# Patient Record
Sex: Male | Born: 1970 | Race: White | Hispanic: No | Marital: Married | Smoking: Current every day smoker
Health system: Southern US, Community
[De-identification: ages and names within clinical notes are randomized; demographics above are authoritative.]

## PROBLEM LIST (undated history)

## (undated) DIAGNOSIS — G629 Polyneuropathy, unspecified: Secondary | ICD-10-CM

## (undated) DIAGNOSIS — A048 Other specified bacterial intestinal infections: Secondary | ICD-10-CM

## (undated) DIAGNOSIS — M4300 Spondylolysis, site unspecified: Secondary | ICD-10-CM

## (undated) HISTORY — PX: UPPER GI ENDOSCOPY: SHX6162

## (undated) HISTORY — PX: HERNIA REPAIR: SHX51

## (undated) HISTORY — PX: OTHER SURGICAL HISTORY: SHX169

## (undated) HISTORY — PX: CHOLECYSTECTOMY: SHX55

---

## 2014-09-19 ENCOUNTER — Emergency Department (HOSPITAL_COMMUNITY)
Admission: EM | Admit: 2014-09-19 | Discharge: 2014-09-20 | Disposition: A | Discharge status: Home / Self Care | Payer: Medicare PPO | Attending: Emergency Medicine | Admitting: Emergency Medicine

## 2014-09-19 ENCOUNTER — Encounter (HOSPITAL_COMMUNITY): Payer: Self-pay | Admitting: Emergency Medicine

## 2014-09-19 DIAGNOSIS — Z8669 Personal history of other diseases of the nervous system and sense organs: Secondary | ICD-10-CM | POA: Diagnosis not present

## 2014-09-19 DIAGNOSIS — Z8739 Personal history of other diseases of the musculoskeletal system and connective tissue: Secondary | ICD-10-CM | POA: Diagnosis not present

## 2014-09-19 DIAGNOSIS — Z9889 Other specified postprocedural states: Secondary | ICD-10-CM | POA: Diagnosis not present

## 2014-09-19 DIAGNOSIS — R1013 Epigastric pain: Secondary | ICD-10-CM | POA: Insufficient documentation

## 2014-09-19 DIAGNOSIS — Z720 Tobacco use: Secondary | ICD-10-CM | POA: Insufficient documentation

## 2014-09-19 DIAGNOSIS — Z8619 Personal history of other infectious and parasitic diseases: Secondary | ICD-10-CM | POA: Diagnosis not present

## 2014-09-19 DIAGNOSIS — Z79899 Other long term (current) drug therapy: Secondary | ICD-10-CM | POA: Diagnosis not present

## 2014-09-19 DIAGNOSIS — Z9089 Acquired absence of other organs: Secondary | ICD-10-CM | POA: Diagnosis not present

## 2014-09-19 HISTORY — DX: Other specified bacterial intestinal infections: A04.8

## 2014-09-19 HISTORY — DX: Polyneuropathy, unspecified: G62.9

## 2014-09-19 HISTORY — DX: Spondylolysis, site unspecified: M43.00

## 2014-09-19 MED ORDER — ONDANSETRON 8 MG PO TBDP
8.0000 mg | ORAL_TABLET | Freq: Once | ORAL | Status: AC
  Administered 2014-09-19: 8 mg via ORAL
  Filled 2014-09-19: qty 1

## 2014-09-19 NOTE — ED Notes (Signed)
Patient c/o burning in his stomach, worsens when he eats. States @ 1 week ago he was having watery yellow diarrhea, last episode this am. On average is having 2-3 episodes of diarrhea per day, states he is also having increased urination, c/o bladder pressure as well. Patient has not been evaluated for this episode of illness, states he started taking his ulcer medication @ 1 week ago (Carafate). Hx of H Pylori, was positive a few years ago and again a few months ago. Patient states this feels similar to his H pylori in the past, except previously he also had bloody stools which he denies at this time. Patient had colonoscopy and endoscopy a few months ago which showed polyps and inflamed stomach lining.

## 2014-09-20 LAB — CBC WITH DIFFERENTIAL/PLATELET
BASOS ABS: 0 10*3/uL (ref 0.0–0.1)
Basophils Relative: 0 % (ref 0–1)
Eosinophils Absolute: 0.3 10*3/uL (ref 0.0–0.7)
Eosinophils Relative: 2 % (ref 0–5)
HCT: 45.9 % (ref 39.0–52.0)
Hemoglobin: 16.2 g/dL (ref 13.0–17.0)
LYMPHS ABS: 3.2 10*3/uL (ref 0.7–4.0)
Lymphocytes Relative: 23 % (ref 12–46)
MCH: 30.2 pg (ref 26.0–34.0)
MCHC: 35.3 g/dL (ref 30.0–36.0)
MCV: 85.5 fL (ref 78.0–100.0)
MONO ABS: 0.9 10*3/uL (ref 0.1–1.0)
Monocytes Relative: 6 % (ref 3–12)
NEUTROS ABS: 9.7 10*3/uL — AB (ref 1.7–7.7)
Neutrophils Relative %: 69 % (ref 43–77)
Platelets: 260 10*3/uL (ref 150–400)
RBC: 5.37 MIL/uL (ref 4.22–5.81)
RDW: 13.2 % (ref 11.5–15.5)
WBC: 14.1 10*3/uL — ABNORMAL HIGH (ref 4.0–10.5)

## 2014-09-20 LAB — URINALYSIS, ROUTINE W REFLEX MICROSCOPIC
Bilirubin Urine: NEGATIVE
Glucose, UA: NEGATIVE mg/dL
HGB URINE DIPSTICK: NEGATIVE
Ketones, ur: NEGATIVE mg/dL
LEUKOCYTES UA: NEGATIVE
Nitrite: NEGATIVE
PH: 6.5 (ref 5.0–8.0)
Protein, ur: NEGATIVE mg/dL
Specific Gravity, Urine: 1.011 (ref 1.005–1.030)
Urobilinogen, UA: 0.2 mg/dL (ref 0.0–1.0)

## 2014-09-20 LAB — COMPREHENSIVE METABOLIC PANEL
ALBUMIN: 4.5 g/dL (ref 3.5–5.2)
ALT: 13 U/L (ref 0–53)
AST: 14 U/L (ref 0–37)
Alkaline Phosphatase: 77 U/L (ref 39–117)
Anion gap: 10 (ref 5–15)
BILIRUBIN TOTAL: 0.6 mg/dL (ref 0.3–1.2)
BUN: 7 mg/dL (ref 6–23)
CALCIUM: 9.8 mg/dL (ref 8.4–10.5)
CO2: 26 mmol/L (ref 19–32)
Chloride: 102 mmol/L (ref 96–112)
Creatinine, Ser: 0.93 mg/dL (ref 0.50–1.35)
GFR calc Af Amer: >90 mL/min (ref >90)
GLUCOSE: 101 mg/dL — AB (ref 70–99)
Potassium: 3.5 mmol/L (ref 3.5–5.1)
Sodium: 138 mmol/L (ref 135–145)
Total Protein: 7.7 g/dL (ref 6.0–8.3)

## 2014-09-20 LAB — I-STAT TROPONIN, ED: TROPONIN I, POC: 0 ng/mL (ref 0.00–0.08)

## 2014-09-20 LAB — LIPASE, BLOOD: LIPASE: 43 U/L (ref 11–59)

## 2014-09-20 MED ORDER — GI COCKTAIL ~~LOC~~
30.0000 mL | Freq: Once | ORAL | Status: AC
  Administered 2014-09-20: 30 mL via ORAL
  Filled 2014-09-20: qty 30

## 2014-09-20 MED ORDER — AMOXICILLIN 500 MG PO CAPS: 500.0000 mg | ORAL_CAPSULE | Freq: Two times a day (BID) | ORAL | Status: DC

## 2014-09-20 MED ORDER — ESOMEPRAZOLE MAGNESIUM 40 MG PO CPDR: DELAYED_RELEASE_CAPSULE | ORAL | Status: DC

## 2014-09-20 MED ORDER — SUCRALFATE 1 GM/10ML PO SUSP: 1.0000 g | Freq: Three times a day (TID) | ORAL | Status: DC

## 2014-09-20 MED ORDER — CLARITHROMYCIN 500 MG PO TABS: 500.0000 mg | ORAL_TABLET | Freq: Two times a day (BID) | ORAL | Status: DC

## 2014-09-20 NOTE — ED Provider Notes (Signed)
CSN: [id number]     Arrival date & time 09/19/14  2253 History   First MD Initiated Contact with Patient 09/20/14 [phone]     Chief Complaint  Patient presents with  . Abdominal Pain     (Consider location/radiation/quality/duration/timing/severity/associated sxs/prior Treatment) HPI  This is a 44 year old male with a history of chronic back pain as well as Helicobacter pylori infection. He has been having stomach trouble since October of last year. This consist of a constant burning pain in his epigastrium made worse with eating or drinking. He has been placed on 2 courses of triple therapy for H. pylori in the past. He has been treated with Nexium and Carafate.  He had been on chronic narcotic therapy for his back pain which he discontinued last year. He thinks this may precipitated his stomach problems. Several months ago he was placed back on oxycodone for his back pain. He states this To his GI symptoms in check. He decided that he does not wish to be on narcotics any longer and discontinued his oxycodone about a week ago. Within one day he had a return of his severe, burning, epigastric pain which he states is worse than the pain he had in his back. He has had occasional nausea but no vomiting. He has had diarrhea which she describes as yellow but not bloody or black. He is also having some discomfort in his bladder which is constant but somewhat worse when urinating. He denies burning with urination or burning in his penis. He has not had a fever. He does have perianal burning which he attributes to stomach acid.  He started taking his Carafate again without relief. He is on Nexium as well.  He states his previous gastroenterologist "didn't do anything for me although he did fine irritation of my stomach when he did an endoscopy". He states he has had CT scans in the past which have "always been normal". He is status post cholecystectomy. The patient is noted the area does not have a local  physician.  Past Medical History  Diagnosis Date  . H. pylori infection   . Neuropathy   . Spondylolysis    Past Surgical History  Procedure Laterality Date  . Hernia repair    . Colonscopy    . Cholecystectomy    . Upper gi endoscopy     No family history on file. History  Substance Use Topics  . Smoking status: Current Every Day Smoker -- 0.20 packs/day    Types: Cigarettes  . Smokeless tobacco: Not on file  . Alcohol Use: No    Review of Systems  All other systems reviewed and are negative.   Allergies  Clindamycin/lincomycin and Lomotil  Home Medications   Prior to Admission medications   Medication Sig Start Date End Date Taking? Authorizing Provider  acidophilus (RISAQUAD) CAPS capsule Take 1 capsule by mouth daily.   Yes Historical Provider, MD  calcium carbonate (TUMS - DOSED IN MG ELEMENTAL CALCIUM) 500 MG chewable tablet Chew 1 tablet by mouth 2 (two) times daily with a meal.   Yes Historical Provider, MD  Calcium Carbonate-Simethicone (ROLAIDS PLUS GAS RELIEF EX ST PO) Take 1 tablet by mouth 2 (two) times daily as needed.   Yes Historical Provider, MD  clonazePAM (KLONOPIN) 1 MG tablet Take 1 mg by mouth 2 (two) times daily.   Yes Historical Provider, MD  esomeprazole (NEXIUM) 20 MG capsule Take 40 mg by mouth daily at 12 noon.   Yes Historical Provider,  MD  oxyCODONE-acetaminophen (PERCOCET) 10-325 MG per tablet Take 1 tablet by mouth every 4 (four) hours as needed for pain.   Yes Historical Provider, MD  sucralfate (CARAFATE) 1 G tablet Take 1 g by mouth 4 (four) times daily -  with meals and at bedtime.   Yes Historical Provider, MD   BP 137/80 mmHg  Pulse 92  Temp(Src) 97.8 F (36.6 C) (Oral)  Resp 20  SpO2 94%   Physical Exam  General: Well-developed, well-nourished male in no acute distress; appearance consistent with age of record HENT: normocephalic; atraumatic Eyes: pupils equal, round and reactive to light; extraocular muscles intact Neck:  supple Heart: regular rate and rhythm Lungs: clear to auscultation bilaterally Abdomen: soft; nondistended; mild periumbilical tenderness; no masses or hepatosplenomegaly; bowel sounds present Extremities: No deformity; full range of motion; pulses normal Neurologic: Awake, alert and oriented; motor function intact in all extremities and symmetric; no facial droop Skin: Warm and dry Psychiatric: Circumstantial speech    ED Course  Procedures (including critical care time)   MDM   Nursing notes and vitals signs, including pulse oximetry, reviewed.  Summary of this visit's results, reviewed by myself:  Labs:  Results for orders placed or performed during the hospital encounter of 09/19/14 (from the past 24 hour(s))  CBC with Differential     Status: Abnormal   Collection Time: 09/20/14 12:43 AM  Result Value Ref Range   WBC 14.1 (H) 4.0 - 10.5 K/uL   RBC 5.37 4.22 - 5.81 MIL/uL   Hemoglobin 16.2 13.0 - 17.0 g/dL   HCT 84.1 32.4 - 40.1 %   MCV 85.5 78.0 - 100.0 fL   MCH 30.2 26.0 - 34.0 pg   MCHC 35.3 30.0 - 36.0 g/dL   RDW 02.7 25.3 - 66.4 %   Platelets 260 150 - 400 K/uL   Neutrophils Relative % 69 43 - 77 %   Neutro Abs 9.7 (H) 1.7 - 7.7 K/uL   Lymphocytes Relative 23 12 - 46 %   Lymphs Abs 3.2 0.7 - 4.0 K/uL   Monocytes Relative 6 3 - 12 %   Monocytes Absolute 0.9 0.1 - 1.0 K/uL   Eosinophils Relative 2 0 - 5 %   Eosinophils Absolute 0.3 0.0 - 0.7 K/uL   Basophils Relative 0 0 - 1 %   Basophils Absolute 0.0 0.0 - 0.1 K/uL  Comprehensive metabolic panel     Status: Abnormal   Collection Time: 09/20/14 12:43 AM  Result Value Ref Range   Sodium 138 135 - 145 mmol/L   Potassium 3.5 3.5 - 5.1 mmol/L   Chloride 102 96 - 112 mmol/L   CO2 26 19 - 32 mmol/L   Glucose, Bld 101 (H) 70 - 99 mg/dL   BUN 7 6 - 23 mg/dL   Creatinine, Ser 4.03 0.50 - 1.35 mg/dL   Calcium 9.8 8.4 - 47.4 mg/dL   Total Protein 7.7 6.0 - 8.3 g/dL   Albumin 4.5 3.5 - 5.2 g/dL   AST 14 0 - 37 U/L    ALT 13 0 - 53 U/L   Alkaline Phosphatase 77 39 - 117 U/L   Total Bilirubin 0.6 0.3 - 1.2 mg/dL   GFR calc non Af Amer >90 >90 mL/min   GFR calc Af Amer >90 >90 mL/min   Anion gap 10 5 - 15  Lipase, blood     Status: None   Collection Time: 09/20/14 12:43 AM  Result Value Ref Range   Lipase  43 11 - 59 U/L  I-stat troponin, ED (only if pt is 45 y.o. or older & pain is above umbilicus) - [facility]     Status: None   Collection Time: 09/20/14 12:46 AM  Result Value Ref Range   Troponin i, poc 0.00 0.00 - 0.08 ng/mL   Comment 3          Urinalysis, Routine w reflex microscopic     Status: None   Collection Time: 09/20/14  3:23 AM  Result Value Ref Range   Color, Urine YELLOW YELLOW   APPearance CLEAR CLEAR   Specific Gravity, Urine 1.011 1.005 - 1.030   pH 6.5 5.0 - 8.0   Glucose, UA NEGATIVE NEGATIVE mg/dL   Hgb urine dipstick NEGATIVE NEGATIVE   Bilirubin Urine NEGATIVE NEGATIVE   Ketones, ur NEGATIVE NEGATIVE mg/dL   Protein, ur NEGATIVE NEGATIVE mg/dL   Urobilinogen, UA 0.2 0.0 - 1.0 mg/dL   Nitrite NEGATIVE NEGATIVE   Leukocytes, UA NEGATIVE NEGATIVE   4:32 AM The patient did not get any relief of his epigastric discomfort with a GI cocktail. He suspects, and I agree, that his diarrhea is likely due to rebound after discontinuing his oxycodone. His epigastric pain is likely gastritis or peptic ulcer disease with pain of which may also have been masked by his oxycodone. He is requesting liquid Carafate as that seems to work better than Carafate tablets for him. He would also like to be treated again for Helicobacter pylori. Although retreatment guidelines recommend Flagyl and tetracycline, because of tetracyclines tendency to be upsetting to the stomach I think this would not be a regimen he would tolerate. We will try Biaxin and amoxicillin and continue his Nexium and switch to liquid Carafate. Will refer to gastroenterology locally.       Paula LibraJohn Quinteria Chisum, MD 09/20/14  [phone]

## 2014-09-20 NOTE — ED Notes (Signed)
MD at bedside. 

## 2015-03-09 DIAGNOSIS — M545 Low back pain, unspecified: Secondary | ICD-10-CM | POA: Insufficient documentation

## 2015-04-11 ENCOUNTER — Other Ambulatory Visit: Payer: Self-pay | Admitting: Orthopedic Surgery

## 2015-04-11 DIAGNOSIS — M5432 Sciatica, left side: Principal | ICD-10-CM

## 2015-04-11 DIAGNOSIS — M5431 Sciatica, right side: Secondary | ICD-10-CM

## 2015-04-27 ENCOUNTER — Ambulatory Visit
Admission: RE | Admit: 2015-04-27 | Discharge: 2015-04-27 | Disposition: A | Discharge status: Home / Self Care | Payer: Medicare PPO | Source: Ambulatory Visit | Attending: Orthopedic Surgery | Admitting: Orthopedic Surgery

## 2015-04-27 DIAGNOSIS — M5432 Sciatica, left side: Principal | ICD-10-CM

## 2015-04-27 DIAGNOSIS — M5431 Sciatica, right side: Secondary | ICD-10-CM

## 2015-07-21 DIAGNOSIS — M5481 Occipital neuralgia: Secondary | ICD-10-CM | POA: Insufficient documentation

## 2015-08-24 ENCOUNTER — Other Ambulatory Visit (HOSPITAL_COMMUNITY): Payer: Self-pay | Admitting: Psychiatry

## 2015-09-11 ENCOUNTER — Other Ambulatory Visit: Payer: Self-pay | Admitting: Internal Medicine

## 2015-09-11 DIAGNOSIS — G4486 Cervicogenic headache: Secondary | ICD-10-CM | POA: Insufficient documentation

## 2015-09-11 DIAGNOSIS — M542 Cervicalgia: Secondary | ICD-10-CM

## 2015-09-19 ENCOUNTER — Ambulatory Visit
Admission: RE | Admit: 2015-09-19 | Discharge: 2015-09-19 | Disposition: A | Discharge status: Home / Self Care | Payer: Medicare PPO | Source: Ambulatory Visit | Attending: Internal Medicine | Admitting: Internal Medicine

## 2015-09-19 DIAGNOSIS — M542 Cervicalgia: Secondary | ICD-10-CM

## 2015-11-07 DIAGNOSIS — I10 Essential (primary) hypertension: Secondary | ICD-10-CM | POA: Insufficient documentation

## 2015-11-08 DIAGNOSIS — F32A Depression, unspecified: Secondary | ICD-10-CM | POA: Insufficient documentation

## 2015-11-08 DIAGNOSIS — F419 Anxiety disorder, unspecified: Secondary | ICD-10-CM | POA: Insufficient documentation

## 2015-11-08 DIAGNOSIS — G4700 Insomnia, unspecified: Secondary | ICD-10-CM | POA: Insufficient documentation

## 2015-11-08 DIAGNOSIS — F329 Major depressive disorder, single episode, unspecified: Secondary | ICD-10-CM | POA: Insufficient documentation

## 2015-11-14 DIAGNOSIS — M5136 Other intervertebral disc degeneration, lumbar region: Secondary | ICD-10-CM | POA: Insufficient documentation

## 2016-11-25 IMAGING — MR MR CERVICAL SPINE W/O CM
4 of 5 series · 28 of 48 positions shown · non-contrast
Comparison: None.

CLINICAL DATA: Headache neck pain with left arm weakness for 6
months

EXAM:
MRI CERVICAL SPINE WITHOUT CONTRAST
TECHNIQUE: Multiplanar, multisequence MR imaging of the cervical spine was
performed. No intravenous contrast was administered.

[Series 3: T2 · sagittal · 3.0mm · 0.66mm/px · 6 of 13 slices shown (1 of 2)]
[im 1/13]
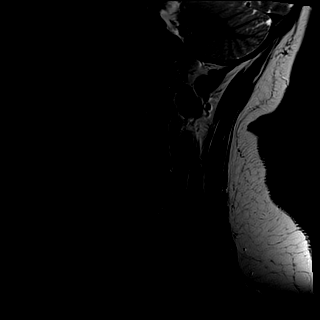
[im 3/13]
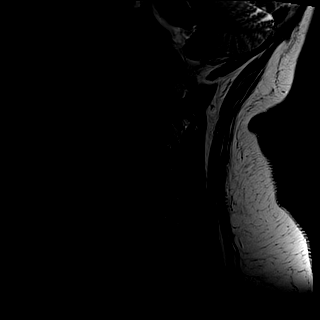
[im 5/13]
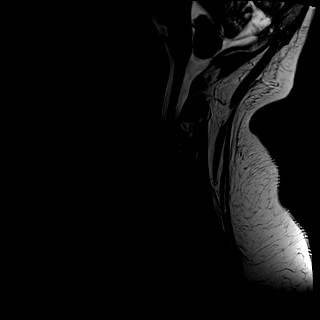
[im 8/13]
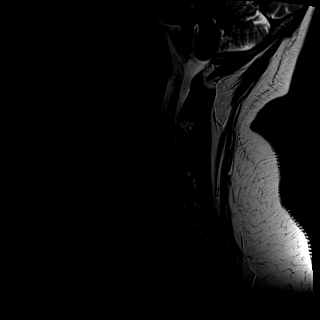
[im 10/13]
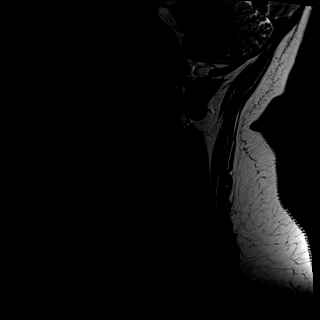
[im 13/13]
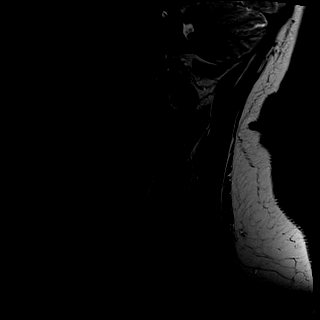

[Series 4: T1 · sagittal · 3.0mm · 0.41mm/px · 7 of 13 slices shown]
[im 1/13]
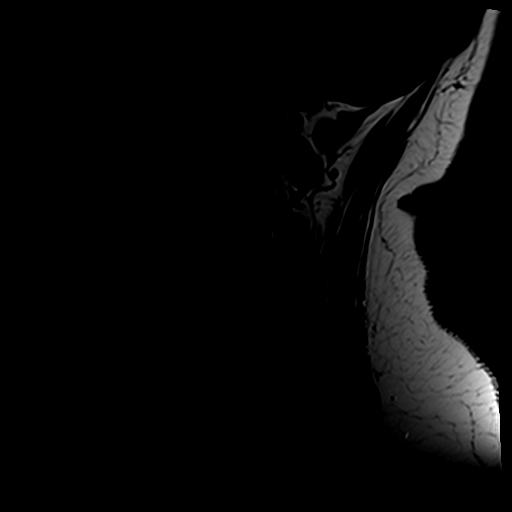
[im 3/13]
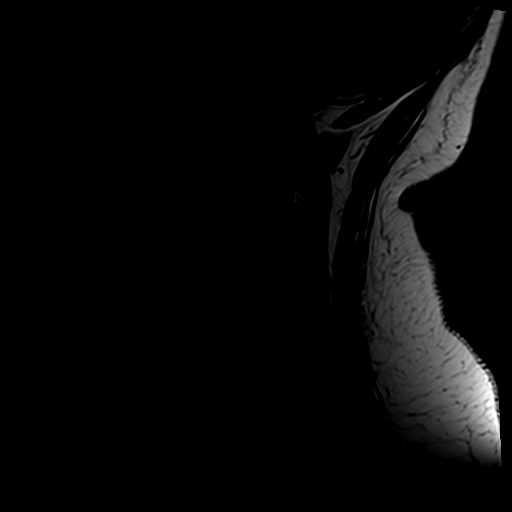
[im 5/13]
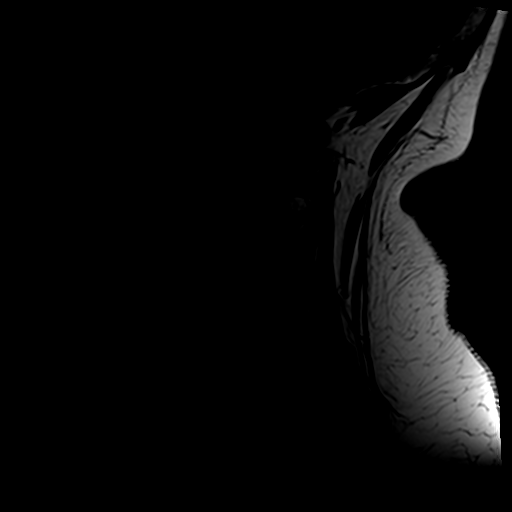
[im 7/13]
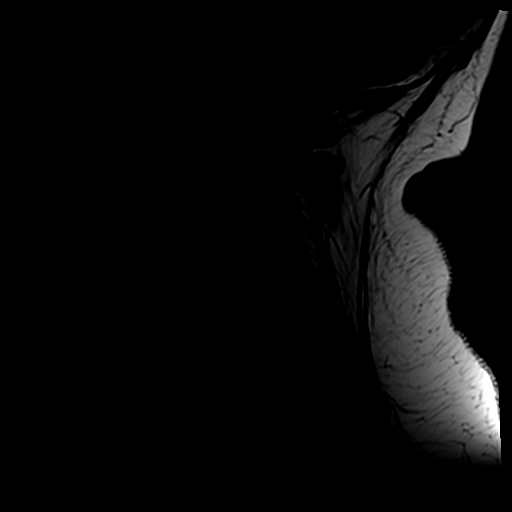
[im 9/13]
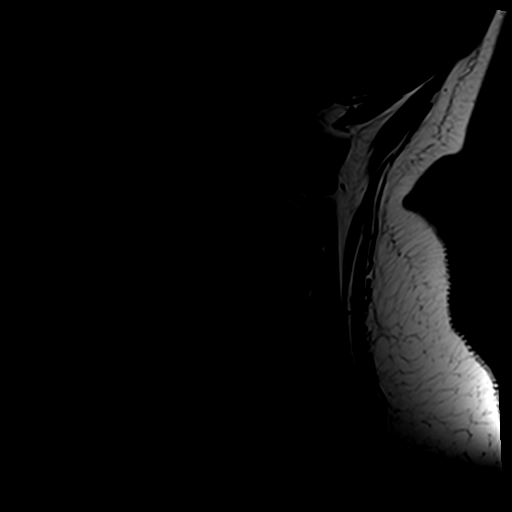
[im 11/13]
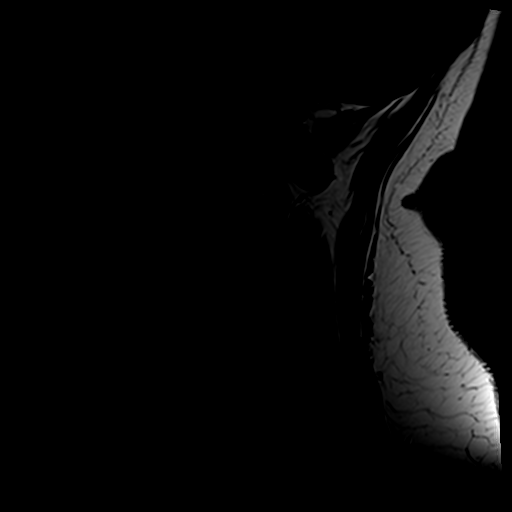
[im 13/13]
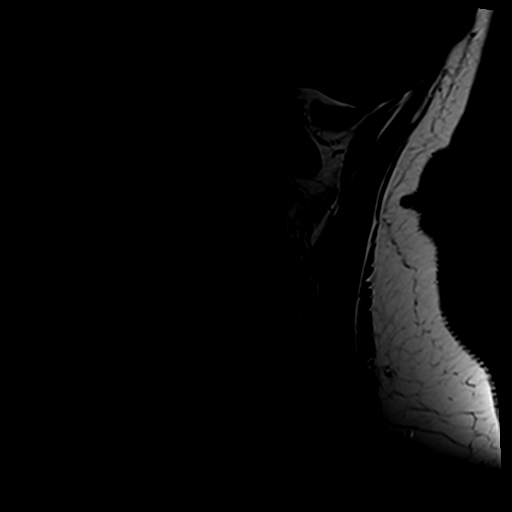

[Series 5: tir sag · sagittal · 3.0mm · 0.41mm/px · 7 of 13 slices shown]
[im 1/13]
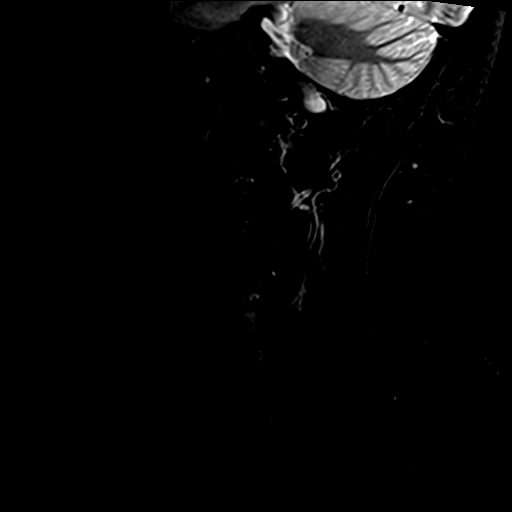
[im 3/13]
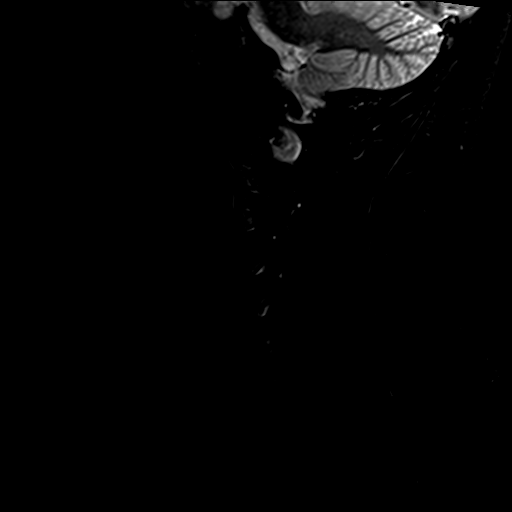
[im 5/13]
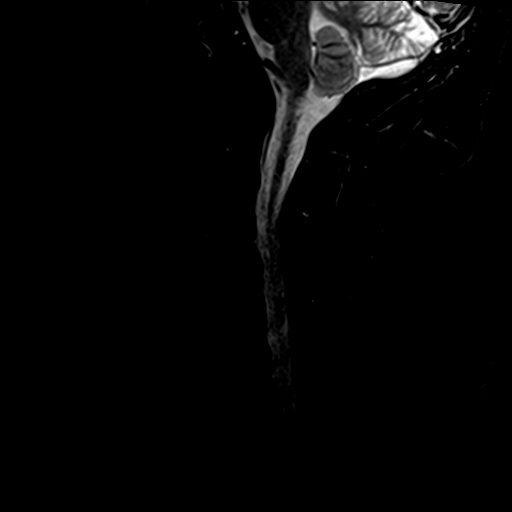
[im 7/13]
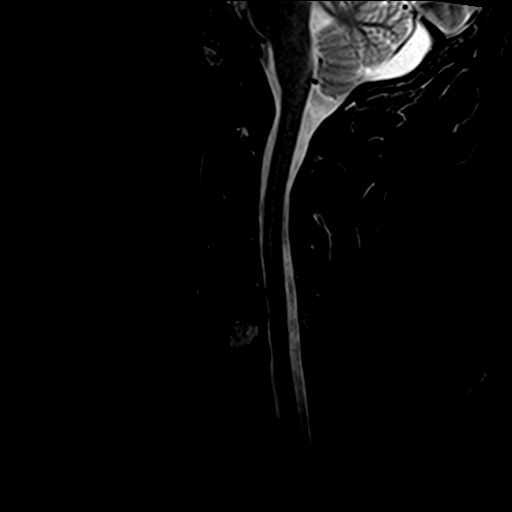
[im 9/13]
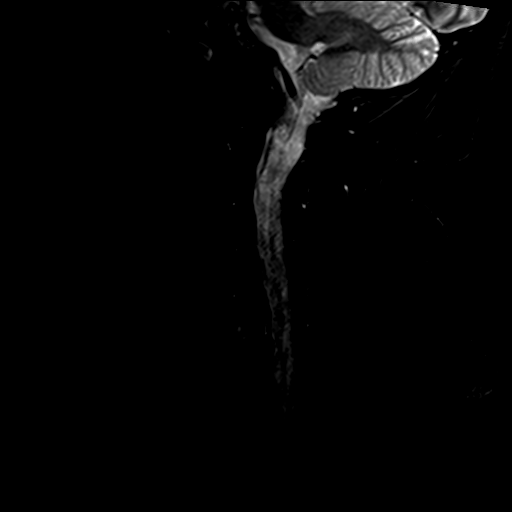
[im 11/13]
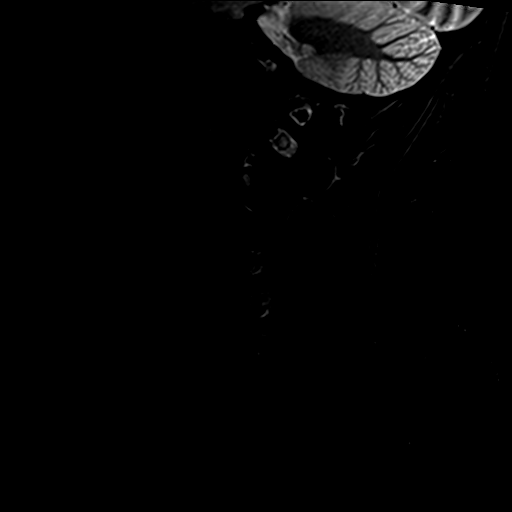
[im 13/13]
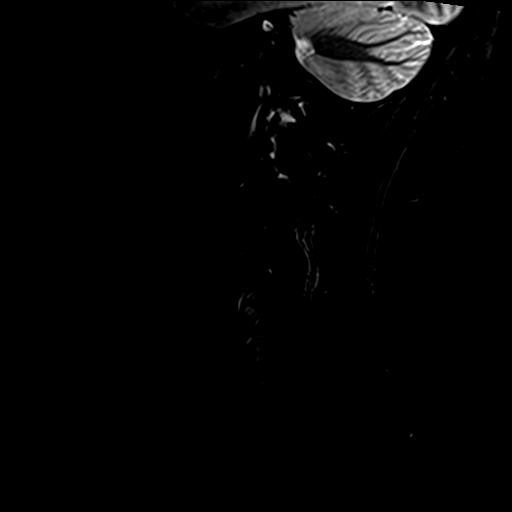

[Series 7: T2 · axial · 3.0mm · 0.78mm/px · z∈[-66,+31]mm · 8 of 28 slices shown (2 of 2)]
[im 1/28]
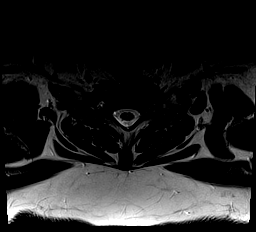
[im 5/28]
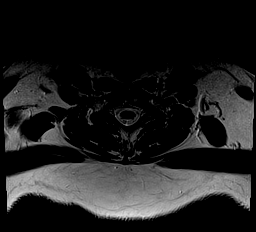
[im 9/28]
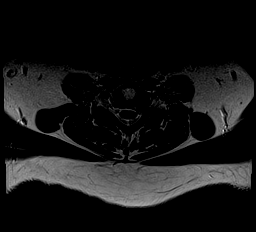
[im 13/28]
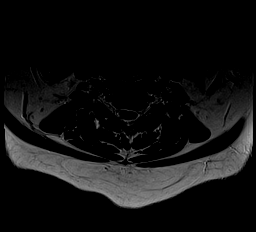
[im 15/28]
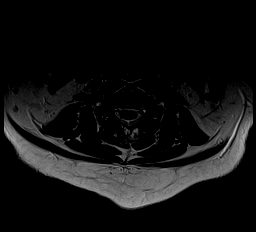
[im 19/28]
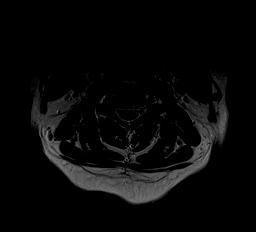
[im 23/28]
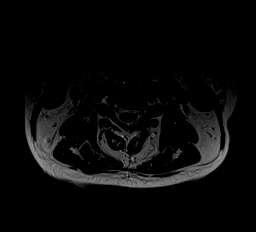
[im 28/28]
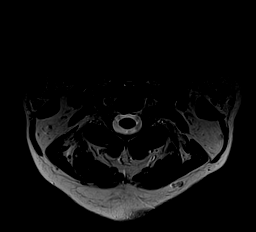

[28 of 48 positions shown; findings below may reference images not displayed]

FINDINGS: Alignment:  Normal cervical lordosis. No static listhesis.

Bones: Vertebral body heights are maintained. T2 hyperintense and T1
hypointense C6 vertebral body lesion with stippled low signal within
the lesion.

Spinal cord:  Cervical cord is normal in size and signal.

Posterior fossa: No focal abnormality.

Vessels:  Normal flow voids are present.

Paraspinal tissues:  No focal abnormality.

Disc levels:

Discs:

C2-3: No significant disc bulge. No neural foraminal stenosis. No
central canal stenosis.

C3-4: No significant disc bulge. No neural foraminal stenosis. No
central canal stenosis.

C4-5: Small shallow right paracentral disc protrusion. No neural
foraminal stenosis. No central canal stenosis.

C5-6: No significant disc bulge. No neural foraminal stenosis. No
central canal stenosis.

C6-7: Left uncovertebral degenerative changes with moderate left
foraminal stenosis. No neural foraminal stenosis. No central canal
stenosis.

C7-T1: No significant disc bulge. No neural foraminal stenosis. No
central canal stenosis.
IMPRESSION: 1. Mild cervical spondylosis as described above.
2. T2 hyperintense and T1 hypointense C6 vertebral body lesion with
stippled low signal within the lesion. This likely represents an
atypical hemangioma, but follow-up MR cervical spine is recommended
in 3-6 months.

## 2017-03-25 DIAGNOSIS — M542 Cervicalgia: Secondary | ICD-10-CM | POA: Insufficient documentation

## 2017-03-25 DIAGNOSIS — M5416 Radiculopathy, lumbar region: Secondary | ICD-10-CM | POA: Insufficient documentation

## 2017-03-25 DIAGNOSIS — G5620 Lesion of ulnar nerve, unspecified upper limb: Secondary | ICD-10-CM | POA: Insufficient documentation

## 2019-03-23 ENCOUNTER — Ambulatory Visit (INDEPENDENT_AMBULATORY_CARE_PROVIDER_SITE_OTHER): Payer: Medicare PPO | Admitting: Adult Health

## 2019-03-23 ENCOUNTER — Encounter: Payer: Self-pay | Admitting: Adult Health

## 2019-03-23 ENCOUNTER — Other Ambulatory Visit: Payer: Self-pay

## 2019-03-23 DIAGNOSIS — G4700 Insomnia, unspecified: Secondary | ICD-10-CM

## 2019-03-23 DIAGNOSIS — F331 Major depressive disorder, recurrent, moderate: Secondary | ICD-10-CM | POA: Diagnosis not present

## 2019-03-23 DIAGNOSIS — F411 Generalized anxiety disorder: Secondary | ICD-10-CM | POA: Diagnosis not present

## 2019-03-23 MED ORDER — TOPIRAMATE 200 MG PO TABS: 200.0000 mg | ORAL_TABLET | Freq: Every day | ORAL | 1 refills | Status: DC

## 2019-03-23 MED ORDER — QUETIAPINE FUMARATE 300 MG PO TABS: ORAL_TABLET | ORAL | 1 refills | Status: DC

## 2019-03-23 MED ORDER — FLUOXETINE HCL 40 MG PO CAPS: 40.0000 mg | ORAL_CAPSULE | Freq: Every day | ORAL | 1 refills | Status: DC

## 2019-03-23 NOTE — Progress Notes (Signed)
Mitchell Webster [id number] [birth year] 48 y.o.  Subjective:   Patient ID:  Mitchell Webster is a 48 y.o. (DOB [birth year]) male.  Chief Complaint: No chief complaint on file.   HPI Mitchell Webster presents to the office today for follow-up of anxiety, depression, and insomnia.  Describes mood today as "ok". Pleasant. Mood symptoms - denies depression, anxiety, and irritability. Stating "I'm doing ok, I can't complain". 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 Stable interest and motivation. Taking medications as prescribed.  Energy levels stable. Active, does not have a regular exercise routine. Walking some days if able. Enjoys some usual interests and activities. Spending time with family - wife. Mostly staying home.  Appetite adequate - trying to eat better. Weight loss - 275 to 264 Sleeps well most nights. Averages 10 hours. Focus and concentration stable. Completing tasks. Managing aspects of household. Disabled. Denies SI or HI. Denies AH or VH.   Review of Systems:  Review of Systems  Musculoskeletal: Negative for gait problem.  Neurological: Negative for tremors.  Psychiatric/Behavioral:       Please refer to HPI    Medications: I have reviewed the patient's current medications.  Current Outpatient Medications  Medication Sig Dispense Refill  . acidophilus (RISAQUAD) CAPS capsule Take 1 capsule by mouth daily.    Marland Kitchen amoxicillin (AMOXIL) 500 MG capsule Take 1 capsule (500 mg total) by mouth 2 (two) times daily. 28 capsule 0  . calcium carbonate (TUMS - DOSED IN MG ELEMENTAL CALCIUM) 500 MG chewable tablet Chew 1 tablet by mouth 2 (two) times daily with a meal.    . Calcium Carbonate-Simethicone (ROLAIDS PLUS GAS RELIEF EX ST PO) Take 1 tablet by mouth 2 (two) times daily as needed.    . clarithromycin (BIAXIN) 500 MG tablet Take 1 tablet (500 mg total) by mouth 2 (two) times daily. 28 tablet 0  . clonazePAM (KLONOPIN) 1 MG tablet Take 1 mg by mouth 2 (two) times daily.    Marland Kitchen esomeprazole (NEXIUM) 40 MG  capsule Take 1 capsule daily. Take at least 30 minutes before first dose of Carafate. 30 capsule 0  . oxyCODONE-acetaminophen (PERCOCET) 10-325 MG per tablet Take 1 tablet by mouth every 4 (four) hours as needed for pain.    Marland Kitchen sucralfate (CARAFATE) 1 GM/10ML suspension Take 10 mLs (1 g total) by mouth 4 (four) times daily -  with meals and at bedtime. 420 mL 1   No current facility-administered medications for this visit.     Medication Side Effects: None  Allergies:  Allergies  Allergen Reactions  . Clindamycin/Lincomycin Hives and Itching  . Lomotil [Diphenoxylate]     Unknown, childhood allergy    Past Medical History:  Diagnosis Date  . H. pylori infection   . Neuropathy   . Spondylolysis     No family history on file.  Social History   Socioeconomic History  . Marital status: Married    Spouse name: Not on file  . Number of children: Not on file  . Years of education: Not on file  . Highest education level: Not on file  Occupational History  . Not on file  Social Needs  . Financial resource strain: Not on file  . Food insecurity    Worry: Not on file    Inability: Not on file  . Transportation needs    Medical: Not on file    Non-medical: Not on file  Tobacco Use  . Smoking status: Current Every Day Smoker    Packs/day: 0.20  Types: Cigarettes  Substance and Sexual Activity  . Alcohol use: No  . Drug use: No  . Sexual activity: Not on file  Lifestyle  . Physical activity    Days per week: Not on file    Minutes per session: Not on file  . Stress: Not on file  Relationships  . Social Musicianconnections    Talks on phone: Not on file    Gets together: Not on file    Attends religious service: Not on file    Active member of club or organization: Not on file    Attends meetings of clubs or organizations: Not on file    Relationship status: Not on file  . Intimate partner violence    Fear of current or ex partner: Not on file    Emotionally abused: Not  on file    Physically abused: Not on file    Forced sexual activity: Not on file  Other Topics Concern  . Not on file  Social History Narrative  . Not on file    Past Medical History, Surgical history, Social history, and Family history were reviewed and updated as appropriate.   Please see review of systems for further details on the patient's review from today.   Objective:   Physical Exam:  There were no vitals taken for this visit.  Physical Exam Constitutional:      General: He is not in acute distress.    Appearance: He is well-developed.  Musculoskeletal:        General: No deformity.  Neurological:     Mental Status: He is alert and oriented to person, place, and time.     Coordination: Coordination normal.  Psychiatric:        Attention and Perception: Attention and perception normal. He does not perceive auditory or visual hallucinations.        Mood and Affect: Mood normal. Mood is not anxious or depressed. Affect is not labile, blunt, angry or inappropriate.        Speech: Speech normal.        Behavior: Behavior normal.        Thought Content: Thought content normal. Thought content is not paranoid or delusional. Thought content does not include homicidal or suicidal ideation. Thought content does not include homicidal or suicidal plan.        Cognition and Memory: Cognition and memory normal.        Judgment: Judgment normal.     Comments: Insight intact     Lab Review:     Component Value Date/Time   NA 138 09/20/2014 0043   K 3.5 09/20/2014 0043   CL 102 09/20/2014 0043   CO2 26 09/20/2014 0043   GLUCOSE 101 (H) 09/20/2014 0043   BUN 7 09/20/2014 0043   CREATININE 0.93 09/20/2014 0043   CALCIUM 9.8 09/20/2014 0043   PROT 7.7 09/20/2014 0043   ALBUMIN 4.5 09/20/2014 0043   AST 14 09/20/2014 0043   ALT 13 09/20/2014 0043   ALKPHOS 77 09/20/2014 0043   BILITOT 0.6 09/20/2014 0043   GFRNONAA >90 09/20/2014 0043   GFRAA >90 09/20/2014 0043        Component Value Date/Time   WBC 14.1 (H) 09/20/2014 0043   RBC 5.37 09/20/2014 0043   HGB 16.2 09/20/2014 0043   HCT 45.9 09/20/2014 0043   PLT 260 09/20/2014 0043   MCV 85.5 09/20/2014 0043   MCH 30.2 09/20/2014 0043   MCHC 35.3 09/20/2014 0043   RDW  13.2 09/20/2014 0043   LYMPHSABS 3.2 09/20/2014 0043   MONOABS 0.9 09/20/2014 0043   EOSABS 0.3 09/20/2014 0043   BASOSABS 0.0 09/20/2014 0043    No results found for: POCLITH, LITHIUM   No results found for: PHENYTOIN, PHENOBARB, VALPROATE, CBMZ   .res Assessment: Plan:    Plan:  1. Seroquel 300mg  - 2 at bedtime 2. Prozac 40mg  daily 3. Topamax 200mg  daily   RTC 4 weeks  Patient advised to contact office with any questions, adverse effects, or acute worsening in signs and symptoms.  There are no diagnoses linked to this encounter.   Please see After Visit Summary for patient specific instructions.  No future appointments.  No orders of the defined types were placed in this encounter.   -------------------------------

## 2019-04-14 DIAGNOSIS — M47816 Spondylosis without myelopathy or radiculopathy, lumbar region: Secondary | ICD-10-CM | POA: Insufficient documentation

## 2019-06-22 ENCOUNTER — Ambulatory Visit (INDEPENDENT_AMBULATORY_CARE_PROVIDER_SITE_OTHER): Payer: Medicare PPO | Admitting: Adult Health

## 2019-06-22 ENCOUNTER — Encounter: Payer: Self-pay | Admitting: Adult Health

## 2019-06-22 DIAGNOSIS — G4700 Insomnia, unspecified: Secondary | ICD-10-CM

## 2019-06-22 DIAGNOSIS — F331 Major depressive disorder, recurrent, moderate: Secondary | ICD-10-CM | POA: Diagnosis not present

## 2019-06-22 DIAGNOSIS — F411 Generalized anxiety disorder: Secondary | ICD-10-CM | POA: Diagnosis not present

## 2019-06-22 MED ORDER — TOPIRAMATE 200 MG PO TABS: 200.0000 mg | ORAL_TABLET | Freq: Every day | ORAL | 1 refills | Status: DC

## 2019-06-22 MED ORDER — FLUOXETINE HCL 40 MG PO CAPS: 40.0000 mg | ORAL_CAPSULE | Freq: Every day | ORAL | 1 refills | Status: DC

## 2019-06-22 MED ORDER — QUETIAPINE FUMARATE 300 MG PO TABS: ORAL_TABLET | ORAL | 1 refills | Status: DC

## 2019-06-22 NOTE — Progress Notes (Signed)
Mitchell Webster [id number] Mar 23, 1971 48 y.o.  Virtual Visit via Telephone Note  I connected with pt on 06/22/19 at  2:Mitchell Webster PM EST by telephone and verified that I am speaking with the correct person using two identifiers.   I discussed the limitations, risks, security and privacy concerns of performing an evaluation and management service by telephone and the availability of in person appointments. I also discussed with the patient that there may be a patient responsible charge related to this service. The patient expressed understanding and agreed to proceed.   I discussed the assessment and treatment plan with the patient. The patient was provided an opportunity to ask questions and all were answered. The patient agreed with the plan and demonstrated an understanding of the instructions.   The patient was advised to call back or seek an in-person evaluation if the symptoms worsen or if the condition fails to improve as anticipated.  I provided 30 minutes of non-face-to-face time during this encounter.  The patient was located at home.  [facility]   Dorothyann Gibbsegina N Jasslyn Finkel, NP   Subjective:   Patient ID:  Mitchell Webster is a 48 y.o. (DOB Mar 23, 1971) male.  Chief Complaint: No chief complaint on file.   HPI   Mitchell FridgeKeith A. Webster presents for follow-up of anxiety, depression, and insomnia.  Describes mood today as "ok". Pleasant. Mood symptoms - denies depression, anxiety, and irritability. Stating "I'm doing good". Feels like everything is "working well". He and wife doing well. Stable interest and motivation. Taking medications as prescribed.  Energy levels stable. Active, does not have a regular exercise routine.  Enjoys some usual interests and activities. Married. Lives with wife. 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 Mostly staying home. Getting out to get groceries.   Appetite adequate - trying to eat better. Weight loss - 265 Sleeps well most nights. Averages 9 to 10  hours. Focus and concentration stable. Completing tasks. Managing aspects of household. Disabled. Denies SI or HI. Denies AH or VH.  Review of Systems:  Review of Systems  Musculoskeletal: Negative for gait problem.  Neurological: Negative for tremors.  Psychiatric/Behavioral:       Please refer to HPI    Medications: I have reviewed the patient's current medications.  Current Outpatient Medications  Medication Sig Dispense Refill  . acidophilus (RISAQUAD) CAPS capsule Take 1 capsule by mouth daily.    Marland Kitchen. albuterol (VENTOLIN HFA) 108 (90 Base) MCG/ACT inhaler INL 2 PFS ITL Q 4 H PRF WHZ    . amoxicillin (AMOXIL) 500 MG capsule Take 1 capsule (500 mg total) by mouth 2 (two) times daily. 28 capsule 0  . calcium carbonate (TUMS - DOSED IN MG ELEMENTAL CALCIUM) 500 MG chewable tablet Chew 1 tablet by mouth 2 (two) times daily with a meal.    . Calcium Carbonate-Simethicone (ROLAIDS PLUS GAS RELIEF EX ST PO) Take 1 tablet by mouth 2 (two) times daily as needed.    . clarithromycin (BIAXIN) 500 MG tablet Take 1 tablet (500 mg total) by mouth 2 (two) times daily. 28 tablet 0  . clonazePAM (KLONOPIN) 1 MG tablet Take 1 mg by mouth 2 (two) times daily.    Marland Kitchen. esomeprazole (NEXIUM) 40 MG capsule Take 1 capsule daily. Take at least 30 minutes before first dose of Carafate. 30 capsule 0  . FLUoxetine (PROZAC) 40 MG capsule Take 1 capsule (40 mg total) by mouth daily. 90 capsule 1  . HYDROcodone-acetaminophen (NORCO/VICODIN) 5-325 MG tablet TK 1 T  PO Q 6 H PRN    . losartan (COZAAR) 50 MG tablet     . oxyCODONE-acetaminophen (PERCOCET) 10-325 MG per tablet Take 1 tablet by mouth every 4 (four) hours as needed for pain.    Marland Kitchen QUEtiapine (SEROQUEL) 300 MG tablet TAKE 2 TABLETS PO HS. 180 tablet 1  . sucralfate (CARAFATE) 1 GM/10ML suspension Take 10 mLs (1 g total) by mouth 4 (four) times daily -  with meals and at bedtime. 420 mL 1  . topiramate (TOPAMAX) 200 MG tablet Take 1 tablet (200 mg total) by  mouth daily. 90 tablet 1   No current facility-administered medications for this visit.    Medication Side Effects: None  Allergies:  Allergies  Allergen Reactions  . Lincomycin Hcl Itching and Hives  . Azithromycin Itching  . Clindamycin/Lincomycin Hives and Itching  . Diphenoxylate Other (See Comments)    Unknown, childhood allergy Unknown, childhood allergy  . Diphenoxylate-Atropine     Unknown, childhood allergy  . Lincomycin Itching and Hives  . Doxycycline Rash    Past Medical History:  Diagnosis Date  . H. pylori infection   . Neuropathy   . Spondylolysis     History reviewed. No pertinent family history.  Social History   Socioeconomic History  . Marital status: Married    Spouse name: Not on file  . Number of children: Not on file  . Years of education: Not on file  . Highest education level: Not on file  Occupational History  . Not on file  Tobacco Use  . Smoking status: Current Every Day Smoker    Packs/day: 0.20    Types: Cigarettes  . Smokeless tobacco: Never Used  Substance and Sexual Activity  . Alcohol use: No  . Drug use: No  . Sexual activity: Not on file  Other Topics Concern  . Not on file  Social History Narrative  . Not on file   Social Determinants of Health   Financial Resource Strain:   . Difficulty of Paying Living Expenses: Not on file  Food Insecurity:   . Worried About Charity fundraiser in the Last Year: Not on file  . Ran Out of Food in the Last Year: Not on file  Transportation Needs:   . Lack of Transportation (Medical): Not on file  . Lack of Transportation (Non-Medical): Not on file  Physical Activity:   . Days of Exercise per Week: Not on file  . Minutes of Exercise per Session: Not on file  Stress:   . Feeling of Stress : Not on file  Social Connections:   . Frequency of Communication with Friends and Family: Not on file  . Frequency of Social Gatherings with Friends and Family: Not on file  . Attends  Religious Services: Not on file  . Active Member of Clubs or Organizations: Not on file  . Attends Archivist Meetings: Not on file  . Marital Status: Not on file  Intimate Partner Violence:   . Fear of Current or Ex-Partner: Not on file  . Emotionally Abused: Not on file  . Physically Abused: Not on file  . Sexually Abused: Not on file    Past Medical History, Surgical history, Social history, and Family history were reviewed and updated as appropriate.   Please see review of systems for further details on the patient's review from today.   Objective:   Physical Exam:  There were no vitals taken for this visit.  Physical Exam Neurological:  Mental Status: He is alert and oriented to person, place, and time.     Cranial Nerves: No dysarthria.  Psychiatric:        Attention and Perception: Attention and perception normal.        Mood and Affect: Mood normal.        Speech: Speech normal.        Behavior: Behavior is cooperative.        Thought Content: Thought content normal. Thought content is not paranoid or delusional. Thought content does not include homicidal or suicidal ideation. Thought content does not include homicidal or suicidal plan.        Cognition and Memory: Cognition and memory normal.        Judgment: Judgment normal.     Comments: Insight intact     Lab Review:     Component Value Date/Time   NA 138 09/20/2014 0043   K 3.5 09/20/2014 0043   CL 102 09/20/2014 0043   CO2 26 09/20/2014 0043   GLUCOSE 101 (H) 09/20/2014 0043   BUN 7 09/20/2014 0043   CREATININE 0.93 09/20/2014 0043   CALCIUM 9.8 09/20/2014 0043   PROT 7.7 09/20/2014 0043   ALBUMIN 4.5 09/20/2014 0043   AST 14 09/20/2014 0043   ALT 13 09/20/2014 0043   ALKPHOS 77 09/20/2014 0043   BILITOT 0.6 09/20/2014 0043   GFRNONAA >90 09/20/2014 0043   GFRAA >90 09/20/2014 0043       Component Value Date/Time   WBC 14.1 (H) 09/20/2014 0043   RBC 5.37 09/20/2014 0043   HGB  16.2 09/20/2014 0043   HCT 45.9 09/20/2014 0043   PLT 260 09/20/2014 0043   MCV 85.5 09/20/2014 0043   MCH 30.2 09/20/2014 0043   MCHC 35.3 09/20/2014 0043   RDW 13.2 09/20/2014 0043   LYMPHSABS 3.2 09/20/2014 0043   MONOABS 0.9 09/20/2014 0043   EOSABS 0.3 09/20/2014 0043   BASOSABS 0.0 09/20/2014 0043    No results found for: POCLITH, LITHIUM   No results found for: PHENYTOIN, PHENOBARB, VALPROATE, CBMZ   .res Assessment: Plan:    Plan:  1. Seroquel 300mg  - 2 at bedtime 2. Prozac 40mg  daily 3. Topamax 200mg  daily   RTC 4 weeks  Patient advised to contact office with any questions, adverse effects, or acute worsening in signs and symptoms.  Discussed potential metabolic side effects associated with atypical antipsychotics, as well as potential risk for movement side effects. Advised pt to contact office if movement side effects occur.   Diagnoses and all orders for this visit:  Insomnia, unspecified type -     QUEtiapine (SEROQUEL) 300 MG tablet; TAKE 2 TABLETS PO HS. -     topiramate (TOPAMAX) 200 MG tablet; Take 1 tablet (200 mg total) by mouth daily.  Major depressive disorder, recurrent episode, moderate (HCC) -     QUEtiapine (SEROQUEL) 300 MG tablet; TAKE 2 TABLETS PO HS. -     FLUoxetine (PROZAC) 40 MG capsule; Take 1 capsule (40 mg total) by mouth daily.  Generalized anxiety disorder -     FLUoxetine (PROZAC) 40 MG capsule; Take 1 capsule (40 mg total) by mouth daily.    Please see After Visit Summary for patient specific instructions.  No future appointments.  No orders of the defined types were placed in this encounter.     -------------------------------

## 2019-06-28 DIAGNOSIS — M5412 Radiculopathy, cervical region: Secondary | ICD-10-CM | POA: Insufficient documentation

## 2019-08-23 DIAGNOSIS — M48061 Spinal stenosis, lumbar region without neurogenic claudication: Secondary | ICD-10-CM | POA: Insufficient documentation

## 2019-09-20 ENCOUNTER — Ambulatory Visit (INDEPENDENT_AMBULATORY_CARE_PROVIDER_SITE_OTHER): Payer: Medicare PPO | Admitting: Adult Health

## 2019-09-20 DIAGNOSIS — F331 Major depressive disorder, recurrent, moderate: Secondary | ICD-10-CM | POA: Diagnosis not present

## 2019-09-20 DIAGNOSIS — G4700 Insomnia, unspecified: Secondary | ICD-10-CM | POA: Diagnosis not present

## 2019-09-20 DIAGNOSIS — F411 Generalized anxiety disorder: Secondary | ICD-10-CM

## 2019-09-20 MED ORDER — FLUOXETINE HCL 40 MG PO CAPS: 40.0000 mg | ORAL_CAPSULE | Freq: Every day | ORAL | 1 refills | Status: DC

## 2019-09-20 MED ORDER — TOPIRAMATE 200 MG PO TABS: 200.0000 mg | ORAL_TABLET | Freq: Every day | ORAL | 1 refills | Status: DC

## 2019-09-20 MED ORDER — QUETIAPINE FUMARATE 300 MG PO TABS: ORAL_TABLET | ORAL | 1 refills | Status: DC

## 2019-09-20 NOTE — Progress Notes (Signed)
Mitchell Webster [id number] [birth year] 49 y.o.  Virtual Visit via Telephone Note  I connected with pt on 09/20/19 at  2:00 PM EDT by telephone and verified that I am speaking with the correct person using two identifiers.   I discussed the limitations, risks, security and privacy concerns of performing an evaluation and management service by telephone and the availability of in person appointments. I also discussed with the patient that there may be a patient responsible charge related to this service. The patient expressed understanding and agreed to proceed.   I discussed the assessment and treatment plan with the patient. The patient was provided an opportunity to ask questions and all were answered. The patient agreed with the plan and demonstrated an understanding of the instructions.   The patient was advised to call back or seek an in-person evaluation if the symptoms worsen or if the condition fails to improve as anticipated.  I provided 30 minutes of non-face-to-face time during this encounter.  The patient was located at home.  The provider was located at [facility].   Mitchell Gell, NP   Subjective:   Patient ID:  Mitchell Webster is a 49 y.o. (DOB [birth year]) male.  Chief Complaint: No chief complaint on file.   HPI Mitchell Webster presents for follow-up of anxiety, depression, and insomnia.  Describes mood today as "pretty good". Pleasant. Mood symptoms - denies depression, anxiety, and irritability. Stating "everything seems to be going alright". Planting a garden. He and wife doing well. Has booked some cruises. Stable interest and motivation. Taking medications as prescribed.  Energy levels stable. Active, does not have a regular exercise routine.  Enjoys some usual interests and activities. Married. Lives with wife of 25 years. Caretaker of wife - blind. Getting out some.  Appetite adequate - trying to eat better. Weight loss - 265. Sleeps well most nights.  Averages 9 to 10 hours. Denies daytime napping. Focus and concentration stable. Completing tasks. Managing aspects of household. Disabled. Denies SI or HI. Denies AH or VH . Review of Systems:  Review of Systems  Musculoskeletal: Negative for gait problem.  Neurological: Negative for tremors.  Psychiatric/Behavioral:       Please refer to HPI    Medications: I have reviewed the patient's current medications.  Current Outpatient Medications  Medication Sig Dispense Refill  . acidophilus (RISAQUAD) CAPS capsule Take 1 capsule by mouth daily.    Marland Kitchen albuterol (VENTOLIN HFA) 108 (90 Base) MCG/ACT inhaler INL 2 PFS ITL Q 4 H PRF WHZ    . amoxicillin (AMOXIL) 500 MG capsule Take 1 capsule (500 mg total) by mouth 2 (two) times daily. 28 capsule 0  . calcium carbonate (TUMS - DOSED IN MG ELEMENTAL CALCIUM) 500 MG chewable tablet Chew 1 tablet by mouth 2 (two) times daily with a meal.    . Calcium Carbonate-Simethicone (ROLAIDS PLUS GAS RELIEF EX ST PO) Take 1 tablet by mouth 2 (two) times daily as needed.    . clarithromycin (BIAXIN) 500 MG tablet Take 1 tablet (500 mg total) by mouth 2 (two) times daily. 28 tablet 0  . clonazePAM (KLONOPIN) 1 MG tablet Take 1 mg by mouth 2 (two) times daily.    Marland Kitchen esomeprazole (NEXIUM) 40 MG capsule Take 1 capsule daily. Take at least 30 minutes before first dose of Carafate. 30 capsule 0  . FLUoxetine (PROZAC) 40 MG capsule Take 1 capsule (40 mg total) by mouth daily. 90 capsule 1  . HYDROcodone-acetaminophen (NORCO/VICODIN) 5-325  MG tablet TK 1 T PO Q 6 H PRN    . losartan (COZAAR) 50 MG tablet     . oxyCODONE-acetaminophen (PERCOCET) 10-325 MG per tablet Take 1 tablet by mouth every 4 (four) hours as needed for pain.    Marland Kitchen QUEtiapine (SEROQUEL) 300 MG tablet TAKE 2 TABLETS PO HS. 180 tablet 1  . sucralfate (CARAFATE) 1 GM/10ML suspension Take 10 mLs (1 g total) by mouth 4 (four) times daily -  with meals and at bedtime. 420 mL 1  . topiramate (TOPAMAX) 200 MG  tablet Take 1 tablet (200 mg total) by mouth daily. 90 tablet 1   No current facility-administered medications for this visit.    Medication Side Effects: None  Allergies:  Allergies  Allergen Reactions  . Lincomycin Hcl Itching and Hives  . Azithromycin Itching  . Clindamycin/Lincomycin Hives and Itching  . Diphenoxylate Other (See Comments)    Unknown, childhood allergy Unknown, childhood allergy  . Diphenoxylate-Atropine     Unknown, childhood allergy  . Lincomycin Itching and Hives  . Doxycycline Rash    Past Medical History:  Diagnosis Date  . H. pylori infection   . Neuropathy   . Spondylolysis     No family history on file.  Social History   Socioeconomic History  . Marital status: Married    Spouse name: Not on file  . Number of children: Not on file  . Years of education: Not on file  . Highest education level: Not on file  Occupational History  . Not on file  Tobacco Use  . Smoking status: Current Every Day Smoker    Packs/day: 0.20    Types: Cigarettes  . Smokeless tobacco: Never Used  Substance and Sexual Activity  . Alcohol use: No  . Drug use: No  . Sexual activity: Not on file  Other Topics Concern  . Not on file  Social History Narrative  . Not on file   Social Determinants of Health   Financial Resource Strain:   . Difficulty of Paying Living Expenses:   Food Insecurity:   . Worried About Programme researcher, broadcasting/film/video in the Last Year:   . Barista in the Last Year:   Transportation Needs:   . Freight forwarder (Medical):   Marland Kitchen Lack of Transportation (Non-Medical):   Physical Activity:   . Days of Exercise per Week:   . Minutes of Exercise per Session:   Stress:   . Feeling of Stress :   Social Connections:   . Frequency of Communication with Friends and Family:   . Frequency of Social Gatherings with Friends and Family:   . Attends Religious Services:   . Active Member of Clubs or Organizations:   . Attends Tax inspector Meetings:   Marland Kitchen Marital Status:   Intimate Partner Violence:   . Fear of Current or Ex-Partner:   . Emotionally Abused:   Marland Kitchen Physically Abused:   . Sexually Abused:     Past Medical History, Surgical history, Social history, and Family history were reviewed and updated as appropriate.   Please see review of systems for further details on the patient's review from today.   Objective:   Physical Exam:  There were no vitals taken for this visit.  Physical Exam Neurological:     Mental Status: He is alert and oriented to person, place, and time.     Cranial Nerves: No dysarthria.  Psychiatric:  Attention and Perception: Attention and perception normal.        Mood and Affect: Mood normal.        Speech: Speech normal.        Behavior: Behavior is cooperative.        Thought Content: Thought content normal. Thought content is not paranoid or delusional. Thought content does not include homicidal or suicidal ideation. Thought content does not include homicidal or suicidal plan.        Cognition and Memory: Cognition and memory normal.        Judgment: Judgment normal.     Comments: Insight intact     Lab Review:     Component Value Date/Time   NA 138 09/20/2014 0043   K 3.5 09/20/2014 0043   CL 102 09/20/2014 0043   CO2 26 09/20/2014 0043   GLUCOSE 101 (H) 09/20/2014 0043   BUN 7 09/20/2014 0043   CREATININE 0.93 09/20/2014 0043   CALCIUM 9.8 09/20/2014 0043   PROT 7.7 09/20/2014 0043   ALBUMIN 4.5 09/20/2014 0043   AST 14 09/20/2014 0043   ALT 13 09/20/2014 0043   ALKPHOS 77 09/20/2014 0043   BILITOT 0.6 09/20/2014 0043   GFRNONAA >90 09/20/2014 0043   GFRAA >90 09/20/2014 0043       Component Value Date/Time   WBC 14.1 (H) 09/20/2014 0043   RBC 5.37 09/20/2014 0043   HGB 16.2 09/20/2014 0043   HCT 45.9 09/20/2014 0043   PLT 260 09/20/2014 0043   MCV 85.5 09/20/2014 0043   MCH 30.2 09/20/2014 0043   MCHC 35.3 09/20/2014 0043   RDW 13.2  09/20/2014 0043   LYMPHSABS 3.2 09/20/2014 0043   MONOABS 0.9 09/20/2014 0043   EOSABS 0.3 09/20/2014 0043   BASOSABS 0.0 09/20/2014 0043    No results found for: POCLITH, LITHIUM   No results found for: PHENYTOIN, PHENOBARB, VALPROATE, CBMZ   .res Assessment: Plan:    Plan:  1. Seroquel 300mg  - 2 at bedtime 2. Prozac 40mg  daily 3. Topamax 200mg  daily   RTC 3 months.  Patient advised to contact office with any questions, adverse effects, or acute worsening in signs and symptoms.  Discussed potential metabolic side effects associated with atypical antipsychotics, as well as potential risk for movement side effects. Advised pt to contact office if movement side effects occur.   There are no diagnoses linked to this encounter.  Please see After Visit Summary for patient specific instructions.  Future Appointments  Date Time Provider Department Center  09/20/2019  2:00 PM Rie Mcneil, , NP [facility] None    No orders of the defined types were placed in this encounter.     -------------------------------

## 2019-12-21 ENCOUNTER — Encounter: Payer: Self-pay | Admitting: Adult Health

## 2019-12-21 ENCOUNTER — Telehealth (INDEPENDENT_AMBULATORY_CARE_PROVIDER_SITE_OTHER): Payer: Medicare PPO | Admitting: Adult Health

## 2019-12-21 DIAGNOSIS — G4700 Insomnia, unspecified: Secondary | ICD-10-CM

## 2019-12-21 DIAGNOSIS — F331 Major depressive disorder, recurrent, moderate: Secondary | ICD-10-CM | POA: Diagnosis not present

## 2019-12-21 DIAGNOSIS — F411 Generalized anxiety disorder: Secondary | ICD-10-CM

## 2019-12-21 MED ORDER — FLUOXETINE HCL 10 MG PO CAPS: ORAL_CAPSULE | ORAL | 3 refills | Status: DC

## 2019-12-21 MED ORDER — TOPIRAMATE 200 MG PO TABS: 200.0000 mg | ORAL_TABLET | Freq: Every day | ORAL | 3 refills | Status: DC

## 2019-12-21 MED ORDER — QUETIAPINE FUMARATE 300 MG PO TABS: ORAL_TABLET | ORAL | 3 refills | Status: DC

## 2019-12-21 NOTE — Progress Notes (Signed)
Mitchell Webster [id number] November 22, 1970 49 y.o.  Virtual Visit via Video Note  I connected with pt @ on 12/21/19 at  2:00 PM EDT by a video enabled telemedicine application and verified that I am speaking with the correct person using two identifiers.   I discussed the limitations of evaluation and management by telemedicine and the availability of in person appointments. The patient expressed understanding and agreed to proceed.  I discussed the assessment and treatment plan with the patient. The patient was provided an opportunity to ask questions and all were answered. The patient agreed with the plan and demonstrated an understanding of the instructions.   The patient was advised to call back or seek an in-person evaluation if the symptoms worsen or if the condition fails to improve as anticipated.  I provided 20 minutes of non-face-to-face time during this encounter.  The patient was located at home.  The provider was located at [facility].   Mitchell Gibbs, Mitchell Webster   Subjective:   Patient ID:  Mitchell Webster is a 49 y.o. (DOB 1970-12-03) male.  Chief Complaint: No chief complaint on file.   HPI Mitchell Webster presents for follow-up of anxiety, depression, and insomnia.  Describes mood today as "ok". Pleasant. Mood symptoms - denies depression, anxiety, and irritability. Stating "I'm doing ok". Having some GI issues - has decreased Prozac and it's "not as bad". PCP has started him on a "statin" for his cholesterol. He and wife have a cruise to [place] planned. Stable interest and motivation. Taking medications as prescribed.  Energy levels stable. Active, does not have a regular exercise routine.  Enjoys some usual interests and activities. Married. Lives with wife of 25 years. Caretaker of wife - blind. Getting out more. Has booked some cruises. Appetite adequate. Weight gain - 273 pounds.  Sleeps well most nights. Averages 9 to 10 hours. Denies napping. Focus and concentration  stable. Completing tasks. Managing aspects of household. Disabled -back/neck. Denies SI or HI. Denies AH or VH   Review of Systems:  Review of Systems  Musculoskeletal: Negative for gait problem.  Neurological: Negative for tremors.  Psychiatric/Behavioral:       Please refer to HPI    Medications: I have reviewed the patient's current medications.  Current Outpatient Medications  Medication Sig Dispense Refill  . albuterol (VENTOLIN HFA) 108 (90 Base) MCG/ACT inhaler INL 2 PFS ITL Q 4 H PRF WHZ    . Calcium Carbonate-Simethicone (ROLAIDS PLUS GAS RELIEF EX ST PO) Take 1 tablet by mouth 2 (two) times daily as needed.    Marland Kitchen FLUoxetine (PROZAC) 10 MG capsule Take three capsules daily. 270 capsule 3  . FLUoxetine (PROZAC) 40 MG capsule Take 1 capsule (40 mg total) by mouth daily. 90 capsule 1  . HYDROcodone-acetaminophen (NORCO/VICODIN) 5-325 MG tablet TK 1 T PO Q 6 H PRN    . losartan (COZAAR) 50 MG tablet     . QUEtiapine (SEROQUEL) 300 MG tablet TAKE 2 TABLETS PO HS. 180 tablet 3  . topiramate (TOPAMAX) 200 MG tablet Take 1 tablet (200 mg total) by mouth daily. 90 tablet 3   No current facility-administered medications for this visit.    Medication Side Effects: None  Allergies:  Allergies  Allergen Reactions  . Lincomycin Hcl Itching and Hives  . Azithromycin Itching  . Clindamycin/Lincomycin Hives and Itching  . Diphenoxylate Other (See Comments)    Unknown, childhood allergy Unknown, childhood allergy  . Diphenoxylate-Atropine     Unknown, childhood  allergy  . Lincomycin Itching and Hives  . Doxycycline Rash    Past Medical History:  Diagnosis Date  . H. pylori infection   . Neuropathy   . Spondylolysis     No family history on file.  Social History   Socioeconomic History  . Marital status: Married    Spouse name: Not on file  . Number of children: Not on file  . Years of education: Not on file  . Highest education level: Not on file  Occupational  History  . Not on file  Tobacco Use  . Smoking status: Current Every Day Smoker    Packs/day: 0.20    Types: Cigarettes  . Smokeless tobacco: Never Used  Substance and Sexual Activity  . Alcohol use: No  . Drug use: No  . Sexual activity: Not on file  Other Topics Concern  . Not on file  Social History Narrative  . Not on file   Social Determinants of Health   Financial Resource Strain:   . Difficulty of Paying Living Expenses:   Food Insecurity:   . Worried About Programme researcher, broadcasting/film/video in the Last Year:   . Barista in the Last Year:   Transportation Needs:   . Freight forwarder (Medical):   Marland Kitchen Lack of Transportation (Non-Medical):   Physical Activity:   . Days of Exercise per Week:   . Minutes of Exercise per Session:   Stress:   . Feeling of Stress :   Social Connections:   . Frequency of Communication with Friends and Family:   . Frequency of Social Gatherings with Friends and Family:   . Attends Religious Services:   . Active Member of Clubs or Organizations:   . Attends Banker Meetings:   Marland Kitchen Marital Status:   Intimate Partner Violence:   . Fear of Current or Ex-Partner:   . Emotionally Abused:   Marland Kitchen Physically Abused:   . Sexually Abused:     Past Medical History, Surgical history, Social history, and Family history were reviewed and updated as appropriate.   Please see review of systems for further details on the patient's review from today.   Objective:   Physical Exam:  There were no vitals taken for this visit.  Physical Exam Constitutional:      General: He is not in acute distress. Musculoskeletal:        General: No deformity.  Neurological:     Mental Status: He is alert and oriented to person, place, and time.     Coordination: Coordination normal.  Psychiatric:        Attention and Perception: Attention and perception normal. He does not perceive auditory or visual hallucinations.        Mood and Affect: Mood normal.  Mood is not anxious or depressed. Affect is not labile, blunt, angry or inappropriate.        Speech: Speech normal.        Behavior: Behavior normal.        Thought Content: Thought content normal. Thought content is not paranoid or delusional. Thought content does not include homicidal or suicidal ideation. Thought content does not include homicidal or suicidal plan.        Cognition and Memory: Cognition and memory normal.        Judgment: Judgment normal.     Comments: Insight intact     Lab Review:     Component Value Date/Time   NA 138 09/20/2014 0043  K 3.5 09/20/2014 0043   CL 102 09/20/2014 0043   CO2 26 09/20/2014 0043   GLUCOSE 101 (H) 09/20/2014 0043   BUN 7 09/20/2014 0043   CREATININE 0.93 09/20/2014 0043   CALCIUM 9.8 09/20/2014 0043   PROT 7.7 09/20/2014 0043   ALBUMIN 4.5 09/20/2014 0043   AST 14 09/20/2014 0043   ALT 13 09/20/2014 0043   ALKPHOS 77 09/20/2014 0043   BILITOT 0.6 09/20/2014 0043   GFRNONAA >90 09/20/2014 0043   GFRAA >90 09/20/2014 0043       Component Value Date/Time   WBC 14.1 (H) 09/20/2014 0043   RBC 5.37 09/20/2014 0043   HGB 16.2 09/20/2014 0043   HCT 45.9 09/20/2014 0043   PLT 260 09/20/2014 0043   MCV 85.5 09/20/2014 0043   MCH 30.2 09/20/2014 0043   MCHC 35.3 09/20/2014 0043   RDW 13.2 09/20/2014 0043   LYMPHSABS 3.2 09/20/2014 0043   MONOABS 0.9 09/20/2014 0043   EOSABS 0.3 09/20/2014 0043   BASOSABS 0.0 09/20/2014 0043    No results found for: POCLITH, LITHIUM   No results found for: PHENYTOIN, PHENOBARB, VALPROATE, CBMZ   .res Assessment: Plan:    Plan:  1. Seroquel 300mg  - 2 at bedtime 2. Decrease Prozac 40mg  to 30mg  daily - GI isssues 3. Topamax 200mg  daily   RTC 3 months.  Patient advised to contact office with any questions, adverse effects, or acute worsening in signs and symptoms.  Discussed potential metabolic side effects associated with atypical antipsychotics, as well as potential risk for  movement side effects. Advised pt to contact office if movement side effects occur.    Diagnoses and all orders for this visit:  Major depressive disorder, recurrent episode, moderate (HCC) -     QUEtiapine (SEROQUEL) 300 MG tablet; TAKE 2 TABLETS PO HS. -     FLUoxetine (PROZAC) 10 MG capsule; Take three capsules daily.  Generalized anxiety disorder -     FLUoxetine (PROZAC) 10 MG capsule; Take three capsules daily.  Insomnia, unspecified type -     QUEtiapine (SEROQUEL) 300 MG tablet; TAKE 2 TABLETS PO HS. -     topiramate (TOPAMAX) 200 MG tablet; Take 1 tablet (200 mg total) by mouth daily.     Please see After Visit Summary for patient specific instructions.  No future appointments.  No orders of the defined types were placed in this encounter.     -------------------------------

## 2019-12-24 ENCOUNTER — Telehealth: Payer: Self-pay

## 2019-12-24 NOTE — Telephone Encounter (Signed)
Prior authorization submitted and approved for FLUOXETINE 10 MG CAPSULES #270/90 DAY effective 06/25/2019-06/23/2020 with [employer] ID# [id number], rx bin [id number], pcn [id number], grp [id number]

## 2020-03-21 ENCOUNTER — Other Ambulatory Visit: Payer: Self-pay

## 2020-03-21 ENCOUNTER — Telehealth (INDEPENDENT_AMBULATORY_CARE_PROVIDER_SITE_OTHER): Payer: Medicare PPO | Admitting: Adult Health

## 2020-03-21 ENCOUNTER — Encounter: Payer: Self-pay | Admitting: Adult Health

## 2020-03-21 DIAGNOSIS — F331 Major depressive disorder, recurrent, moderate: Secondary | ICD-10-CM

## 2020-03-21 DIAGNOSIS — F411 Generalized anxiety disorder: Secondary | ICD-10-CM | POA: Diagnosis not present

## 2020-03-21 DIAGNOSIS — G4700 Insomnia, unspecified: Secondary | ICD-10-CM | POA: Diagnosis not present

## 2020-03-21 NOTE — Progress Notes (Signed)
Mitchell Webster [id number] March 08, 1971 49 y.o.  Virtual Visit via Video Note  I connected with pt @ on 03/21/20 at  1:00 PM EDT by a video enabled telemedicine application and verified that I am speaking with the correct person using two identifiers.   I discussed the limitations of evaluation and management by telemedicine and the availability of in person appointments. The patient expressed understanding and agreed to proceed.  I discussed the assessment and treatment plan with the patient. The patient was provided an opportunity to ask questions and all were answered. The patient agreed with the plan and demonstrated an understanding of the instructions.   The patient was advised to call back or seek an in-person evaluation if the symptoms worsen or if the condition fails to improve as anticipated.  I provided 30 minutes of non-face-to-face time during this encounter.  The patient was located at home.  The provider was located at [facility].   Dorothyann Gibbs, NP   Subjective:   Patient ID:  Mitchell Webster is a 49 y.o. (DOB Jun 13, 1971) male.  Chief Complaint: No chief complaint on file.   HPI Mitchell Webster presents for follow-up of anxiety, depression, and insomnia.  Describes mood today as "ok". Pleasant. Mood symptoms - denies depression, anxiety, and irritability. Stating "everything is ok, I have no complaints". Decreased GI issues since going down on Prozac from 40mg  to 30mg  daily. Seeing PCP regularly. Vacation planned for December. Stable interest and motivation. Taking medications as prescribed.  Energy levels stable. Active, does not have a regular exercise routine.  Enjoys some usual interests and activities. Married. Lives with wife of 25 years. Caretaker of wife - blind. Getting out some. Appetite adequate. Weight stable - 273 pounds.  Sleeps well most nights. Averages 9 to 10 hours.  Focus and concentration stable. Completing tasks. Managing aspects of household.  Disabled -back/neck. Denies SI or HI. Denies AH or VH   Review of Systems:  Review of Systems  Musculoskeletal: Negative for gait problem.  Neurological: Negative for tremors.  Psychiatric/Behavioral:       Please refer to HPI    Medications: I have reviewed the patient's current medications.  Current Outpatient Medications  Medication Sig Dispense Refill   albuterol (VENTOLIN HFA) 108 (90 Base) MCG/ACT inhaler INL 2 PFS ITL Q 4 H PRF WHZ     Calcium Carbonate-Simethicone (ROLAIDS PLUS GAS RELIEF EX ST PO) Take 1 tablet by mouth 2 (two) times daily as needed.     FLUoxetine (PROZAC) 10 MG capsule Take three capsules daily. 270 capsule 3   HYDROcodone-acetaminophen (NORCO/VICODIN) 5-325 MG tablet TK 1 T PO Q 6 H PRN     losartan (COZAAR) 50 MG tablet      QUEtiapine (SEROQUEL) 300 MG tablet TAKE 2 TABLETS PO HS. 180 tablet 3   topiramate (TOPAMAX) 200 MG tablet Take 1 tablet (200 mg total) by mouth daily. 90 tablet 3   No current facility-administered medications for this visit.    Medication Side Effects: None  Allergies:  Allergies  Allergen Reactions   Lincomycin Hcl Itching and Hives   Azithromycin Itching   Clindamycin/Lincomycin Hives and Itching   Diphenoxylate Other (See Comments)    Unknown, childhood allergy Unknown, childhood allergy   Diphenoxylate-Atropine     Unknown, childhood allergy   Lincomycin Itching and Hives   Doxycycline Rash    Past Medical History:  Diagnosis Date   H. pylori infection    Neuropathy  Spondylolysis     No family history on file.  Social History   Socioeconomic History   Marital status: Married    Spouse name: Not on file   Number of children: Not on file   Years of education: Not on file   Highest education level: Not on file  Occupational History   Not on file  Tobacco Use   Smoking status: Current Every Day Smoker    Packs/day: 0.20    Types: Cigarettes   Smokeless tobacco: Never  Used  Substance and Sexual Activity   Alcohol use: No   Drug use: No   Sexual activity: Not on file  Other Topics Concern   Not on file  Social History Narrative   Not on file   Social Determinants of Health   Financial Resource Strain:    Difficulty of Paying Living Expenses: Not on file  Food Insecurity:    Worried About Running Out of Food in the Last Year: Not on file   Ran Out of Food in the Last Year: Not on file  Transportation Needs:    Lack of Transportation (Medical): Not on file   Lack of Transportation (Non-Medical): Not on file  Physical Activity:    Days of Exercise per Week: Not on file   Minutes of Exercise per Session: Not on file  Stress:    Feeling of Stress : Not on file  Social Connections:    Frequency of Communication with Friends and Family: Not on file   Frequency of Social Gatherings with Friends and Family: Not on file   Attends Religious Services: Not on file   Active Member of Clubs or Organizations: Not on file   Attends Banker Meetings: Not on file   Marital Status: Not on file  Intimate Partner Violence:    Fear of Current or Ex-Partner: Not on file   Emotionally Abused: Not on file   Physically Abused: Not on file   Sexually Abused: Not on file    Past Medical History, Surgical history, Social history, and Family history were reviewed and updated as appropriate.   Please see review of systems for further details on the patient's review from today.   Objective:   Physical Exam:  There were no vitals taken for this visit.  Physical Exam Constitutional:      General: He is not in acute distress. Musculoskeletal:        General: No deformity.  Neurological:     Mental Status: He is alert and oriented to person, place, and time.     Coordination: Coordination normal.  Psychiatric:        Attention and Perception: Attention and perception normal. He does not perceive auditory or visual  hallucinations.        Mood and Affect: Mood normal. Mood is not anxious or depressed. Affect is not labile, blunt, angry or inappropriate.        Speech: Speech normal.        Behavior: Behavior normal.        Thought Content: Thought content normal. Thought content is not paranoid or delusional. Thought content does not include homicidal or suicidal ideation. Thought content does not include homicidal or suicidal plan.        Cognition and Memory: Cognition and memory normal.        Judgment: Judgment normal.     Comments: Insight intact     Lab Review:     Component Value Date/Time  NA 138 09/20/2014 0043   K 3.5 09/20/2014 0043   CL 102 09/20/2014 0043   CO2 26 09/20/2014 0043   GLUCOSE 101 (H) 09/20/2014 0043   BUN 7 09/20/2014 0043   CREATININE 0.93 09/20/2014 0043   CALCIUM 9.8 09/20/2014 0043   PROT 7.7 09/20/2014 0043   ALBUMIN 4.5 09/20/2014 0043   AST 14 09/20/2014 0043   ALT 13 09/20/2014 0043   ALKPHOS 77 09/20/2014 0043   BILITOT 0.6 09/20/2014 0043   GFRNONAA >90 09/20/2014 0043   GFRAA >90 09/20/2014 0043       Component Value Date/Time   WBC 14.1 (H) 09/20/2014 0043   RBC 5.37 09/20/2014 0043   HGB 16.2 09/20/2014 0043   HCT 45.9 09/20/2014 0043   PLT 260 09/20/2014 0043   MCV 85.5 09/20/2014 0043   MCH 30.2 09/20/2014 0043   MCHC 35.3 09/20/2014 0043   RDW 13.2 09/20/2014 0043   LYMPHSABS 3.2 09/20/2014 0043   MONOABS 0.9 09/20/2014 0043   EOSABS 0.3 09/20/2014 0043   BASOSABS 0.0 09/20/2014 0043    No results found for: POCLITH, LITHIUM   No results found for: PHENYTOIN, PHENOBARB, VALPROATE, CBMZ   .res Assessment: Plan:    Plan:  1. Seroquel 300mg  - 2 at bedtime 2. Prozac 40mg  to 30mg  daily - GI isssues 3. Topamax 200mg  daily   RTC 3 months.  Patient advised to contact office with any questions, adverse effects, or acute worsening in signs and symptoms.  Discussed potential metabolic side effects associated with atypical  antipsychotics, as well as potential risk for movement side effects. Advised pt to contact office if movement side effects occur.    There are no diagnoses linked to this encounter.   Please see After Visit Summary for patient specific instructions.  No future appointments.  No orders of the defined types were placed in this encounter.     -------------------------------

## 2020-06-20 ENCOUNTER — Telehealth (INDEPENDENT_AMBULATORY_CARE_PROVIDER_SITE_OTHER): Payer: Medicare PPO | Admitting: Adult Health

## 2020-06-20 ENCOUNTER — Encounter: Payer: Self-pay | Admitting: Adult Health

## 2020-06-20 DIAGNOSIS — G4700 Insomnia, unspecified: Secondary | ICD-10-CM | POA: Diagnosis not present

## 2020-06-20 DIAGNOSIS — F411 Generalized anxiety disorder: Secondary | ICD-10-CM | POA: Diagnosis not present

## 2020-06-20 DIAGNOSIS — F331 Major depressive disorder, recurrent, moderate: Secondary | ICD-10-CM

## 2020-06-20 MED ORDER — FLUOXETINE HCL 10 MG PO CAPS
ORAL_CAPSULE | ORAL | 3 refills | Status: DC
Start: 2020-06-20 — End: 2020-09-18

## 2020-06-20 NOTE — Progress Notes (Signed)
Mitchell Webster [id number] [birth year] 49 y.o.  Virtual Visit via Video Note  I connected with pt @ on 06/20/20 at  1:20 PM EST by a video enabled telemedicine application and verified that I am speaking with the correct person using two identifiers.   I discussed the limitations of evaluation and management by telemedicine and the availability of in person appointments. The patient expressed understanding and agreed to proceed.  I discussed the assessment and treatment plan with the patient. The patient was provided an opportunity to ask questions and all were answered. The patient agreed with the plan and demonstrated an understanding of the instructions.   The patient was advised to call back or seek an in-person evaluation if the symptoms worsen or if the condition fails to improve as anticipated.  I provided 30 minutes of non-face-to-face time during this encounter.  The patient was located at home.  The provider was located at [facility].   Dorothyann Gibbs, NP   Subjective:   Patient ID:  Mitchell Webster is a 49 y.o. (DOB 1970/11/22) male.  Chief Complaint: No chief complaint on file.   HPI [name] presents for follow-up of anxiety, depression, and insomnia.  Describes mood today as "ok". Pleasant. Mood symptoms - denies depression, anxiety, and irritability. Stating "I'm doing good". Feels like current medications continue to work well. Seeing PCP regularly. Recent vacation for 14 days. Enjoyed the holidays. Stable interest and motivation. Taking medications as prescribed.  Energy levels stable. Active, does not have a regular exercise routine.  Enjoys some usual interests and activities. Married. Lives with wife of 25 years. Caretaker of wife - blind.  Appetite adequate. Weight gain - 288 pounds. Trying to "diet and eat better".  Sleeps well most nights. Averages 9 to 10 hours.  Focus and concentration stable. Completing tasks. Managing aspects of household.  Disabled -back/neck. Denies SI or HI. Denies AH or VH   Review of Systems:  Review of Systems  Musculoskeletal: Negative for gait problem.  Neurological: Negative for tremors.  Psychiatric/Behavioral:       Please refer to HPI    Medications: I have reviewed the patient's current medications.  Current Outpatient Medications  Medication Sig Dispense Refill  . albuterol (VENTOLIN HFA) 108 (90 Base) MCG/ACT inhaler INL 2 PFS ITL Q 4 H PRF WHZ    . Calcium Carbonate-Simethicone (ROLAIDS PLUS GAS RELIEF EX ST PO) Take 1 tablet by mouth 2 (two) times daily as needed.    Marland Kitchen FLUoxetine (PROZAC) 10 MG capsule Take three capsules daily. 270 capsule 3  . HYDROcodone-acetaminophen (NORCO/VICODIN) 5-325 MG tablet TK 1 T PO Q 6 H PRN    . losartan (COZAAR) 50 MG tablet     . QUEtiapine (SEROQUEL) 300 MG tablet TAKE 2 TABLETS PO HS. 180 tablet 3  . topiramate (TOPAMAX) 200 MG tablet Take 1 tablet (200 mg total) by mouth daily. 90 tablet 3   No current facility-administered medications for this visit.    Medication Side Effects: None  Allergies:  Allergies  Allergen Reactions  . Lincomycin Hcl Itching and Hives  . Azithromycin Itching  . Clindamycin/Lincomycin Hives and Itching  . Diphenoxylate Other (See Comments)    Unknown, childhood allergy Unknown, childhood allergy  . Diphenoxylate-Atropine     Unknown, childhood allergy  . Lincomycin Itching and Hives  . Doxycycline Rash    Past Medical History:  Diagnosis Date  . H. pylori infection   . Neuropathy   . Spondylolysis  No family history on file.  Social History   Socioeconomic History  . Marital status: Married    Spouse name: Not on file  . Number of children: Not on file  . Years of education: Not on file  . Highest education level: Not on file  Occupational History  . Not on file  Tobacco Use  . Smoking status: Current Every Day Smoker    Packs/day: 0.20    Types: Cigarettes  . Smokeless tobacco: Never  Used  Substance and Sexual Activity  . Alcohol use: No  . Drug use: No  . Sexual activity: Not on file  Other Topics Concern  . Not on file  Social History Narrative  . Not on file   Social Determinants of Health   Financial Resource Strain: Not on file  Food Insecurity: Not on file  Transportation Needs: Not on file  Physical Activity: Not on file  Stress: Not on file  Social Connections: Not on file  Intimate Partner Violence: Not on file    Past Medical History, Surgical history, Social history, and Family history were reviewed and updated as appropriate.   Please see review of systems for further details on the patient's review from today.   Objective:   Physical Exam:  There were no vitals taken for this visit.  Physical Exam Constitutional:      General: He is not in acute distress. Musculoskeletal:        General: No deformity.  Neurological:     Mental Status: He is alert and oriented to person, place, and time.     Coordination: Coordination normal.  Psychiatric:        Attention and Perception: Attention and perception normal. He does not perceive auditory or visual hallucinations.        Mood and Affect: Mood normal. Mood is not anxious or depressed. Affect is not labile, blunt, angry or inappropriate.        Speech: Speech normal.        Behavior: Behavior normal.        Thought Content: Thought content normal. Thought content is not paranoid or delusional. Thought content does not include homicidal or suicidal ideation. Thought content does not include homicidal or suicidal plan.        Cognition and Memory: Cognition and memory normal.        Judgment: Judgment normal.     Comments: Insight intact     Lab Review:     Component Value Date/Time   NA 138 09/20/2014 0043   K 3.5 09/20/2014 0043   CL 102 09/20/2014 0043   CO2 26 09/20/2014 0043   GLUCOSE 101 (H) 09/20/2014 0043   BUN 7 09/20/2014 0043   CREATININE 0.93 09/20/2014 0043   CALCIUM 9.8  09/20/2014 0043   PROT 7.7 09/20/2014 0043   ALBUMIN 4.5 09/20/2014 0043   AST 14 09/20/2014 0043   ALT 13 09/20/2014 0043   ALKPHOS 77 09/20/2014 0043   BILITOT 0.6 09/20/2014 0043   GFRNONAA >90 09/20/2014 0043   GFRAA >90 09/20/2014 0043       Component Value Date/Time   WBC 14.1 (H) 09/20/2014 0043   RBC 5.37 09/20/2014 0043   HGB 16.2 09/20/2014 0043   HCT 45.9 09/20/2014 0043   PLT 260 09/20/2014 0043   MCV 85.5 09/20/2014 0043   MCH 30.2 09/20/2014 0043   MCHC 35.3 09/20/2014 0043   RDW 13.2 09/20/2014 0043   LYMPHSABS 3.2 09/20/2014 0043   MONOABS  0.9 09/20/2014 0043   EOSABS 0.3 09/20/2014 0043   BASOSABS 0.0 09/20/2014 0043    No results found for: POCLITH, LITHIUM   No results found for: PHENYTOIN, PHENOBARB, VALPROATE, CBMZ   .res Assessment: Plan:    Plan:  1. Seroquel 300mg  - 2 at bedtime 2. Prozac 30mg  daily 3. Topamax 200mg  daily   RTC 3 months.  Patient advised to contact office with any questions, adverse effects, or acute worsening in signs and symptoms.  Discussed potential metabolic side effects associated with atypical antipsychotics, as well as potential risk for movement side effects. Advised pt to contact office if movement side effects occur.    Diagnoses and all orders for this visit:  Insomnia, unspecified type  Major depressive disorder, recurrent episode, moderate (HCC) -     FLUoxetine (PROZAC) 10 MG capsule; Take three capsules daily.  Generalized anxiety disorder -     FLUoxetine (PROZAC) 10 MG capsule; Take three capsules daily.     Please see After Visit Summary for patient specific instructions.  No future appointments.  No orders of the defined types were placed in this encounter.     -------------------------------

## 2020-09-18 ENCOUNTER — Other Ambulatory Visit: Payer: Self-pay

## 2020-09-18 ENCOUNTER — Telehealth (INDEPENDENT_AMBULATORY_CARE_PROVIDER_SITE_OTHER): Payer: Medicare PPO | Admitting: Adult Health

## 2020-09-18 ENCOUNTER — Encounter: Payer: Self-pay | Admitting: Adult Health

## 2020-09-18 ENCOUNTER — Telehealth: Payer: Self-pay | Admitting: Adult Health

## 2020-09-18 DIAGNOSIS — F411 Generalized anxiety disorder: Secondary | ICD-10-CM

## 2020-09-18 DIAGNOSIS — F331 Major depressive disorder, recurrent, moderate: Secondary | ICD-10-CM | POA: Diagnosis not present

## 2020-09-18 DIAGNOSIS — G4700 Insomnia, unspecified: Secondary | ICD-10-CM

## 2020-09-18 MED ORDER — TOPIRAMATE 200 MG PO TABS: 200.0000 mg | ORAL_TABLET | Freq: Every day | ORAL | 3 refills | Status: DC

## 2020-09-18 MED ORDER — FLUOXETINE HCL 10 MG PO CAPS: ORAL_CAPSULE | ORAL | 3 refills | Status: DC

## 2020-09-18 MED ORDER — QUETIAPINE FUMARATE 300 MG PO TABS
ORAL_TABLET | ORAL | 3 refills | Status: DC
Start: 2020-09-18 — End: 2021-06-22

## 2020-09-18 MED ORDER — FLUOXETINE HCL 20 MG PO CAPS: ORAL_CAPSULE | ORAL | 3 refills | Status: DC

## 2020-09-18 NOTE — Telephone Encounter (Signed)
That's no problem we can update his Rx's. He may have 2 co-pays so if that's an issue we will send for PA>

## 2020-09-18 NOTE — Progress Notes (Signed)
Mitchell Webster [id number] [birth year] 50 y.o.  Virtual Visit via Video Note  I connected with pt @ on 09/18/20 at  1:20 PM EDT by a video enabled telemedicine application and verified that I am speaking with the correct person using two identifiers.   I discussed the limitations of evaluation and management by telemedicine and the availability of in person appointments. The patient expressed understanding and agreed to proceed.  I discussed the assessment and treatment plan with the patient. The patient was provided an opportunity to ask questions and all were answered. The patient agreed with the plan and demonstrated an understanding of the instructions.   The patient was advised to call back or seek an in-person evaluation if the symptoms worsen or if the condition fails to improve as anticipated.  I provided 20 minutes of non-face-to-face time during this encounter.  The patient was located at home.  The provider was located at [facility].   Dorothyann Gibbs, NP   Subjective:   Patient ID:  Mitchell Webster is a 50 y.o. (DOB July 15, 1970) male.  Chief Complaint: No chief complaint on file.   HPI Mitchell Webster presents for follow-up of anxiety, depression, and insomnia.  Describes mood today as "ok". Pleasant. Mood symptoms - denies depression, anxiety, and irritability. Stating "I'm doing pretty good". Concerned about weight gain and plans to start "working" on it. Difficulties working out with physical disabilities. Upcoming cruise for his birthday. Feels like current medications continue to work well. Seeing PCP regularly. Stable interest and motivation. Taking medications as prescribed.  Energy levels stable. Active, does not have a regular exercise routine - plans to start after upcoming cruise. Enjoys some usual interests and activities. Married. Lives with wife. Caretaker of wife - blind.  Appetite adequate. Weight stable - 288 pounds.  Sleeps well most nights. Averages 9  to 10 hours.  Focus and concentration stable. Completing tasks. Managing aspects of household. Disabled -back/neck. Denies SI or HI.  Denies AH or VH     Review of Systems:  Review of Systems  Musculoskeletal: Negative for gait problem.  Neurological: Negative for tremors.  Psychiatric/Behavioral:       Please refer to HPI    Medications: I have reviewed the patient's current medications.  Current Outpatient Medications  Medication Sig Dispense Refill  . albuterol (VENTOLIN HFA) 108 (90 Base) MCG/ACT inhaler INL 2 PFS ITL Q 4 H PRF WHZ    . Calcium Carbonate-Simethicone (ROLAIDS PLUS GAS RELIEF EX ST PO) Take 1 tablet by mouth 2 (two) times daily as needed.    Marland Kitchen FLUoxetine (PROZAC) 10 MG capsule Take three capsules daily. 270 capsule 3  . HYDROcodone-acetaminophen (NORCO/VICODIN) 5-325 MG tablet TK 1 T PO Q 6 H PRN    . losartan (COZAAR) 50 MG tablet     . QUEtiapine (SEROQUEL) 300 MG tablet TAKE 2 TABLETS PO HS. 180 tablet 3  . topiramate (TOPAMAX) 200 MG tablet Take 1 tablet (200 mg total) by mouth daily. 90 tablet 3   No current facility-administered medications for this visit.    Medication Side Effects: None  Allergies:  Allergies  Allergen Reactions  . Lincomycin Hcl Itching and Hives  . Azithromycin Itching  . Clindamycin/Lincomycin Hives and Itching  . Diphenoxylate Other (See Comments)    Unknown, childhood allergy Unknown, childhood allergy  . Diphenoxylate-Atropine     Unknown, childhood allergy  . Lincomycin Itching and Hives  . Doxycycline Rash    Past Medical History:  Diagnosis Date  . H. pylori infection   . Neuropathy   . Spondylolysis     No family history on file.  Social History   Socioeconomic History  . Marital status: Married    Spouse name: Not on file  . Number of children: Not on file  . Years of education: Not on file  . Highest education level: Not on file  Occupational History  . Not on file  Tobacco Use  . Smoking  status: Current Every Day Smoker    Packs/day: 0.20    Types: Cigarettes  . Smokeless tobacco: Never Used  Substance and Sexual Activity  . Alcohol use: No  . Drug use: No  . Sexual activity: Not on file  Other Topics Concern  . Not on file  Social History Narrative  . Not on file   Social Determinants of Health   Financial Resource Strain: Not on file  Food Insecurity: Not on file  Transportation Needs: Not on file  Physical Activity: Not on file  Stress: Not on file  Social Connections: Not on file  Intimate Partner Violence: Not on file    Past Medical History, Surgical history, Social history, and Family history were reviewed and updated as appropriate.   Please see review of systems for further details on the patient's review from today.   Objective:   Physical Exam:  There were no vitals taken for this visit.  Physical Exam Constitutional:      General: He is not in acute distress. Musculoskeletal:        General: No deformity.  Neurological:     Mental Status: He is alert and oriented to person, place, and time.     Coordination: Coordination normal.  Psychiatric:        Attention and Perception: Attention and perception normal. He does not perceive auditory or visual hallucinations.        Mood and Affect: Mood normal. Mood is not anxious or depressed. Affect is not labile, blunt, angry or inappropriate.        Speech: Speech normal.        Behavior: Behavior normal.        Thought Content: Thought content normal. Thought content is not paranoid or delusional. Thought content does not include homicidal or suicidal ideation. Thought content does not include homicidal or suicidal plan.        Cognition and Memory: Cognition and memory normal.        Judgment: Judgment normal.     Comments: Insight intact     Lab Review:     Component Value Date/Time   NA 138 09/20/2014 0043   K 3.5 09/20/2014 0043   CL 102 09/20/2014 0043   CO2 26 09/20/2014 0043    GLUCOSE 101 (H) 09/20/2014 0043   BUN 7 09/20/2014 0043   CREATININE 0.93 09/20/2014 0043   CALCIUM 9.8 09/20/2014 0043   PROT 7.7 09/20/2014 0043   ALBUMIN 4.5 09/20/2014 0043   AST 14 09/20/2014 0043   ALT 13 09/20/2014 0043   ALKPHOS 77 09/20/2014 0043   BILITOT 0.6 09/20/2014 0043   GFRNONAA >90 09/20/2014 0043   GFRAA >90 09/20/2014 0043       Component Value Date/Time   WBC 14.1 (H) 09/20/2014 0043   RBC 5.37 09/20/2014 0043   HGB 16.2 09/20/2014 0043   HCT 45.9 09/20/2014 0043   PLT 260 09/20/2014 0043   MCV 85.5 09/20/2014 0043   MCH 30.2 09/20/2014 0043  MCHC 35.3 09/20/2014 0043   RDW 13.2 09/20/2014 0043   LYMPHSABS 3.2 09/20/2014 0043   MONOABS 0.9 09/20/2014 0043   EOSABS 0.3 09/20/2014 0043   BASOSABS 0.0 09/20/2014 0043    No results found for: POCLITH, LITHIUM   No results found for: PHENYTOIN, PHENOBARB, VALPROATE, CBMZ   .res Assessment: Plan:    Plan:  1. Seroquel 300mg  - 2 at bedtime 2. Prozac 30mg  daily 3. Topamax 200mg  daily   RTC 3 months.  Patient advised to contact office with any questions, adverse effects, or acute worsening in signs and symptoms.  Discussed potential metabolic side effects associated with atypical antipsychotics, as well as potential risk for movement side effects. Advised pt to contact office if movement side effects occur.    Diagnoses and all orders for this visit:  Insomnia, unspecified type  Generalized anxiety disorder  Major depressive disorder, recurrent episode, moderate (HCC)     Please see After Visit Summary for patient specific instructions.  Future Appointments  Date Time Provider Department Center  09/18/2020  1:20 PM Tilia Faso, , NP [facility] None    No orders of the defined types were placed in this encounter.     -------------------------------

## 2020-09-18 NOTE — Telephone Encounter (Signed)
Ok to send

## 2020-09-18 NOTE — Telephone Encounter (Signed)
Pt called and said that he got a letter today stating that the quanitity was an issue for his prozac. He was wondering if you could send in a 20 mg and a 10 mg to see if that will work so a PA doesn't need to be done.

## 2020-12-19 ENCOUNTER — Telehealth (INDEPENDENT_AMBULATORY_CARE_PROVIDER_SITE_OTHER): Payer: Medicare PPO | Admitting: Adult Health

## 2020-12-19 ENCOUNTER — Encounter: Payer: Self-pay | Admitting: Adult Health

## 2020-12-19 DIAGNOSIS — G4700 Insomnia, unspecified: Secondary | ICD-10-CM | POA: Diagnosis not present

## 2020-12-19 DIAGNOSIS — F331 Major depressive disorder, recurrent, moderate: Secondary | ICD-10-CM

## 2020-12-19 DIAGNOSIS — F411 Generalized anxiety disorder: Secondary | ICD-10-CM | POA: Diagnosis not present

## 2020-12-19 NOTE — Progress Notes (Signed)
Mitchell Webster [id number] 03/20/1971 50 y.o.  Virtual Visit via Video Note  I connected with pt @ on 12/19/20 at  1:20 PM EDT by a video enabled telemedicine application and verified that I am speaking with the correct person using two identifiers.   I discussed the limitations of evaluation and management by telemedicine and the availability of in person appointments. The patient expressed understanding and agreed to proceed.  I discussed the assessment and treatment plan with the patient. The patient was provided an opportunity to ask questions and all were answered. The patient agreed with the plan and demonstrated an understanding of the instructions.   The patient was advised to call back or seek an in-person evaluation if the symptoms worsen or if the condition fails to improve as anticipated.  I provided 20 minutes of non-face-to-face time during this encounter.  The patient was located at home.  The provider was located at [facility].   Mitchell Gibbs, NP   Subjective:   Patient ID:  Mitchell Webster is a 50 y.o. (DOB 1971/06/13) male.  Chief Complaint: No chief complaint on file.   HPI Mitchell Webster presents for follow-up of anxiety, depression, and insomnia.  Describes mood today as "ok". Pleasant. Mood symptoms - denies depression, anxiety, and irritability. Stating "I'm doing good". Recent surgery - right arm - "hurting a good bit". Feels like medications continue to work well. He and wife doing well. Planning a trip in the fall - cruise. Seeing PCP regularly. Stable interest and motivation. Taking medications as prescribed.  Energy levels stable. Active, does not have a regular exercise routine. Enjoys some usual interests and activities. Married. Lives with wife. Caretaker of wife - blind.  Appetite adequate. Weight loss - 8 pounds - 280 pounds. Trying to eat healthier. Sleeps well most nights. Averages 9 to 10 hours.  Focus and concentration stable. Completing  tasks. Managing aspects of household. Disabled - back/neck. Denies SI or HI.  Denies AH or VH   Review of Systems:  Review of Systems  Musculoskeletal:  Negative for gait problem.  Neurological:  Negative for tremors.  Psychiatric/Behavioral:         Please refer to HPI   Medications: I have reviewed the patient's current medications.  Current Outpatient Medications  Medication Sig Dispense Refill   albuterol (VENTOLIN HFA) 108 (90 Base) MCG/ACT inhaler INL 2 PFS ITL Q 4 H PRF WHZ     Calcium Carbonate-Simethicone (ROLAIDS PLUS GAS RELIEF EX ST PO) Take 1 tablet by mouth 2 (two) times daily as needed.     FLUoxetine (PROZAC) 10 MG capsule Take one (1 )  Capsule daily with 20 mg 90 capsule 3   FLUoxetine (PROZAC) 20 MG capsule Take one (1) capsule 20 mg by mouth daily with 10 mg. 30 mg total daily 90 capsule 3   HYDROcodone-acetaminophen (NORCO/VICODIN) 5-325 MG tablet TK 1 T PO Q 6 H PRN     losartan (COZAAR) 50 MG tablet      QUEtiapine (SEROQUEL) 300 MG tablet TAKE 2 TABLETS PO HS. 180 tablet 3   topiramate (TOPAMAX) 200 MG tablet Take 1 tablet (200 mg total) by mouth daily. 90 tablet 3   No current facility-administered medications for this visit.    Medication Side Effects: None  Allergies:  Allergies  Allergen Reactions   Lincomycin Hcl Itching and Hives   Azithromycin Itching   Clindamycin/Lincomycin Hives and Itching   Diphenoxylate Other (See Comments)    Unknown,  childhood allergy Unknown, childhood allergy   Diphenoxylate-Atropine     Unknown, childhood allergy   Lincomycin Itching and Hives   Doxycycline Rash    Past Medical History:  Diagnosis Date   H. pylori infection    Neuropathy    Spondylolysis     No family history on file.  Social History   Socioeconomic History   Marital status: Married    Spouse name: Not on file   Number of children: Not on file   Years of education: Not on file   Highest education level: Not on file  Occupational  History   Not on file  Tobacco Use   Smoking status: Every Day    Packs/day: 0.20    Pack years: 0.00    Types: Cigarettes   Smokeless tobacco: Never  Substance and Sexual Activity   Alcohol use: No   Drug use: No   Sexual activity: Not on file  Other Topics Concern   Not on file  Social History Narrative   Not on file   Social Determinants of Health   Financial Resource Strain: Not on file  Food Insecurity: Not on file  Transportation Needs: Not on file  Physical Activity: Not on file  Stress: Not on file  Social Connections: Not on file  Intimate Partner Violence: Not on file    Past Medical History, Surgical history, Social history, and Family history were reviewed and updated as appropriate.   Please see review of systems for further details on the patient's review from today.   Objective:   Physical Exam:  There were no vitals taken for this visit.  Physical Exam Constitutional:      General: He is not in acute distress. Musculoskeletal:        General: No deformity.  Neurological:     Mental Status: He is alert and oriented to person, place, and time.     Coordination: Coordination normal.  Psychiatric:        Attention and Perception: Attention and perception normal. He does not perceive auditory or visual hallucinations.        Mood and Affect: Mood normal. Mood is not anxious or depressed. Affect is not labile, blunt, angry or inappropriate.        Speech: Speech normal.        Behavior: Behavior normal.        Thought Content: Thought content normal. Thought content is not paranoid or delusional. Thought content does not include homicidal or suicidal ideation. Thought content does not include homicidal or suicidal plan.        Cognition and Memory: Cognition and memory normal.        Judgment: Judgment normal.     Comments: Insight intact    Lab Review:     Component Value Date/Time   NA 138 09/20/2014 0043   K 3.5 09/20/2014 0043   CL 102  09/20/2014 0043   CO2 26 09/20/2014 0043   GLUCOSE 101 (H) 09/20/2014 0043   BUN 7 09/20/2014 0043   CREATININE 0.93 09/20/2014 0043   CALCIUM 9.8 09/20/2014 0043   PROT 7.7 09/20/2014 0043   ALBUMIN 4.5 09/20/2014 0043   AST 14 09/20/2014 0043   ALT 13 09/20/2014 0043   ALKPHOS 77 09/20/2014 0043   BILITOT 0.6 09/20/2014 0043   GFRNONAA >90 09/20/2014 0043   GFRAA >90 09/20/2014 0043       Component Value Date/Time   WBC 14.1 (H) 09/20/2014 0043   RBC 5.37  09/20/2014 0043   HGB 16.2 09/20/2014 0043   HCT 45.9 09/20/2014 0043   PLT 260 09/20/2014 0043   MCV 85.5 09/20/2014 0043   MCH 30.2 09/20/2014 0043   MCHC 35.3 09/20/2014 0043   RDW 13.2 09/20/2014 0043   LYMPHSABS 3.2 09/20/2014 0043   MONOABS 0.9 09/20/2014 0043   EOSABS 0.3 09/20/2014 0043   BASOSABS 0.0 09/20/2014 0043    No results found for: POCLITH, LITHIUM   No results found for: PHENYTOIN, PHENOBARB, VALPROATE, CBMZ   .res Assessment: Plan:    Plan:  1. Seroquel 300mg  - 2 at bedtime 2. Prozac 30mg  daily 3. Topamax 200mg  daily   RTC 3 months.  Patient advised to contact office with any questions, adverse effects, or acute worsening in signs and symptoms.  Discussed potential metabolic side effects associated with atypical antipsychotics, as well as potential risk for movement side effects. Advised pt to contact office if movement side effects occur.   Diagnoses and all orders for this visit:  Major depressive disorder, recurrent episode, moderate (HCC)  Generalized anxiety disorder  Insomnia, unspecified type    Please see After Visit Summary for patient specific instructions.  No future appointments.   No orders of the defined types were placed in this encounter.     -------------------------------

## 2021-02-08 ENCOUNTER — Telehealth: Payer: Self-pay

## 2021-02-08 NOTE — Telephone Encounter (Signed)
Prior Authorization submitted and approved for FLUOXETINE 10 MG #90/30 day effective 06/24/2020-06/23/2021 with [employer] [id number], PA# [id number] [phone]

## 2021-03-05 ENCOUNTER — Telehealth: Payer: Self-pay | Admitting: Adult Health

## 2021-03-05 NOTE — Telephone Encounter (Signed)
Eliane Decree stating he would like Regina's nurse to give him a call back. He didn't disclose the context of the call. # [phone].

## 2021-03-05 NOTE — Telephone Encounter (Signed)
Ok to send in the 40mg .

## 2021-03-05 NOTE — Telephone Encounter (Signed)
Rtc to pt and he is requesting to go back to 40 mg Prozac if he can. He's currently on the 30 mg but would rather increase dose plus it's always an issue with insurance not wanting to cover it then a PA needs to be done. If not he will just pick up the 10 mg take 3 daily Prozac, he does have a PA for that.  Has apt on 09/30

## 2021-03-06 ENCOUNTER — Other Ambulatory Visit: Payer: Self-pay

## 2021-03-06 MED ORDER — FLUOXETINE HCL 40 MG PO CAPS: ORAL_CAPSULE | ORAL | 0 refills | Status: DC

## 2021-03-06 NOTE — Telephone Encounter (Signed)
Rx updated and submitted to [facility]

## 2021-03-23 ENCOUNTER — Telehealth (INDEPENDENT_AMBULATORY_CARE_PROVIDER_SITE_OTHER): Payer: Medicare PPO | Admitting: Adult Health

## 2021-03-23 ENCOUNTER — Encounter: Payer: Self-pay | Admitting: Adult Health

## 2021-03-23 DIAGNOSIS — F411 Generalized anxiety disorder: Secondary | ICD-10-CM

## 2021-03-23 DIAGNOSIS — G4700 Insomnia, unspecified: Secondary | ICD-10-CM | POA: Diagnosis not present

## 2021-03-23 DIAGNOSIS — F331 Major depressive disorder, recurrent, moderate: Secondary | ICD-10-CM

## 2021-03-23 MED ORDER — FLUOXETINE HCL 40 MG PO CAPS: ORAL_CAPSULE | ORAL | 3 refills | Status: DC

## 2021-03-23 NOTE — Progress Notes (Signed)
Mitchell Webster [id number] 1970/11/19 50 y.o.  Virtual Visit via Video Note  I connected with pt @ on 03/23/21 at  2:40 PM EDT by a video enabled telemedicine application and verified that I am speaking with the correct person using two identifiers.   I discussed the limitations of evaluation and management by telemedicine and the availability of in person appointments. The patient expressed understanding and agreed to proceed.  I discussed the assessment and treatment plan with the patient. The patient was provided an opportunity to ask questions and all were answered. The patient agreed with the plan and demonstrated an understanding of the instructions.   The patient was advised to call back or seek an in-person evaluation if the symptoms worsen or if the condition fails to improve as anticipated.  I provided 20 minutes of non-face-to-face time during this encounter.  The patient was located at home.  The provider was located at [facility].   Mitchell Gibbs, NP   Subjective:   Patient ID:  Mitchell Webster is a 50 y.o. (DOB [birth year]) male.  Chief Complaint: No chief complaint on file.   HPI Mitchell Webster presents for follow-up of anxiety, depression, and insomnia.  Describes mood today as "ok". Pleasant. Mood symptoms - denies depression, anxiety, and irritability. Stating "everything is going pretty good for me". Feels like medications continue to work well for him. Recent surgery on both arms - hands feel better - "not as numb". Able to get tasks around the house a little easier". Seeing PCP regularly. Stable interest and motivation. Taking medications as prescribed.  Energy levels stable. Active, does not have a regular exercise routine. Enjoys some usual interests and activities. Married. Lives with wife. Caretaker of wife - blind.  Appetite adequate - eating healthier - trying to get cholesterol down.. Weight stable - 8 pounds - 280 pounds.  Sleeps well most nights.  Averages 9 to 10 hours.  Focus and concentration stable. Completing tasks. Managing aspects of household. Disabled - back/neck. Denies SI or HI.  Denies AH or VH   Review of Systems:  Review of Systems  Musculoskeletal:  Negative for gait problem.  Neurological:  Negative for tremors.  Psychiatric/Behavioral:         Please refer to HPI   Medications: I have reviewed the patient's current medications.  Current Outpatient Medications  Medication Sig Dispense Refill   albuterol (VENTOLIN HFA) 108 (90 Base) MCG/ACT inhaler INL 2 PFS ITL Q 4 H PRF WHZ     Calcium Carbonate-Simethicone (ROLAIDS PLUS GAS RELIEF EX ST PO) Take 1 tablet by mouth 2 (two) times daily as needed.     FLUoxetine (PROZAC) 40 MG capsule Take one (1) capsule 40 mg by mouth daily 90 capsule 3   HYDROcodone-acetaminophen (NORCO/VICODIN) 5-325 MG tablet TK 1 T PO Q 6 H PRN     losartan (COZAAR) 50 MG tablet      QUEtiapine (SEROQUEL) 300 MG tablet TAKE 2 TABLETS PO HS. 180 tablet 3   topiramate (TOPAMAX) 200 MG tablet Take 1 tablet (200 mg total) by mouth daily. 90 tablet 3   No current facility-administered medications for this visit.    Medication Side Effects: None  Allergies:  Allergies  Allergen Reactions   Lincomycin Hcl Itching and Hives   Azithromycin Itching   Clindamycin/Lincomycin Hives and Itching   Diphenoxylate Other (See Comments)    Unknown, childhood allergy Unknown, childhood allergy   Diphenoxylate-Atropine     Unknown, childhood allergy  Lincomycin Itching and Hives   Doxycycline Rash    Past Medical History:  Diagnosis Date   H. pylori infection    Neuropathy    Spondylolysis     No family history on file.  Social History   Socioeconomic History   Marital status: Married    Spouse name: Not on file   Number of children: Not on file   Years of education: Not on file   Highest education level: Not on file  Occupational History   Not on file  Tobacco Use   Smoking  status: Every Day    Packs/day: 0.20    Types: Cigarettes   Smokeless tobacco: Never  Substance and Sexual Activity   Alcohol use: No   Drug use: No   Sexual activity: Not on file  Other Topics Concern   Not on file  Social History Narrative   Not on file   Social Determinants of Health   Financial Resource Strain: Not on file  Food Insecurity: Not on file  Transportation Needs: Not on file  Physical Activity: Not on file  Stress: Not on file  Social Connections: Not on file  Intimate Partner Violence: Not on file    Past Medical History, Surgical history, Social history, and Family history were reviewed and updated as appropriate.   Please see review of systems for further details on the patient's review from today.   Objective:   Physical Exam:  There were no vitals taken for this visit.  Physical Exam Constitutional:      General: He is not in acute distress. Musculoskeletal:        General: No deformity.  Neurological:     Mental Status: He is alert and oriented to person, place, and time.     Coordination: Coordination normal.  Psychiatric:        Attention and Perception: Attention and perception normal. He does not perceive auditory or visual hallucinations.        Mood and Affect: Mood normal. Mood is not anxious or depressed. Affect is not labile, blunt, angry or inappropriate.        Speech: Speech normal.        Behavior: Behavior normal.        Thought Content: Thought content normal. Thought content is not paranoid or delusional. Thought content does not include homicidal or suicidal ideation. Thought content does not include homicidal or suicidal plan.        Cognition and Memory: Cognition and memory normal.        Judgment: Judgment normal.     Comments: Insight intact    Lab Review:     Component Value Date/Time   NA 138 09/20/2014 0043   K 3.5 09/20/2014 0043   CL 102 09/20/2014 0043   CO2 26 09/20/2014 0043   GLUCOSE 101 (H) 09/20/2014  0043   BUN 7 09/20/2014 0043   CREATININE 0.93 09/20/2014 0043   CALCIUM 9.8 09/20/2014 0043   PROT 7.7 09/20/2014 0043   ALBUMIN 4.5 09/20/2014 0043   AST 14 09/20/2014 0043   ALT 13 09/20/2014 0043   ALKPHOS 77 09/20/2014 0043   BILITOT 0.6 09/20/2014 0043   GFRNONAA >90 09/20/2014 0043   GFRAA >90 09/20/2014 0043       Component Value Date/Time   WBC 14.1 (H) 09/20/2014 0043   RBC 5.37 09/20/2014 0043   HGB 16.2 09/20/2014 0043   HCT 45.9 09/20/2014 0043   PLT 260 09/20/2014 0043   MCV  85.5 09/20/2014 0043   MCH 30.2 09/20/2014 0043   MCHC 35.3 09/20/2014 0043   RDW 13.2 09/20/2014 0043   LYMPHSABS 3.2 09/20/2014 0043   MONOABS 0.9 09/20/2014 0043   EOSABS 0.3 09/20/2014 0043   BASOSABS 0.0 09/20/2014 0043    No results found for: POCLITH, LITHIUM   No results found for: PHENYTOIN, PHENOBARB, VALPROATE, CBMZ   .res Assessment: Plan:    Plan:  1. Seroquel 300mg  - 2 at bedtime 2. Prozac 30mg  daily 3. Topamax 200mg  daily   RTC 3 months.  Patient advised to contact office with any questions, adverse effects, or acute worsening in signs and symptoms.  Discussed potential metabolic side effects associated with atypical antipsychotics, as well as potential risk for movement side effects. Advised pt to contact office if movement side effects occur.   Diagnoses and all orders for this visit:  Major depressive disorder, recurrent episode, moderate (HCC) -     FLUoxetine (PROZAC) 40 MG capsule; Take one (1) capsule 40 mg by mouth daily  Generalized anxiety disorder -     FLUoxetine (PROZAC) 40 MG capsule; Take one (1) capsule 40 mg by mouth daily  Insomnia, unspecified type    Please see After Visit Summary for patient specific instructions.  No future appointments.  No orders of the defined types were placed in this encounter.     -------------------------------

## 2021-06-22 ENCOUNTER — Encounter: Payer: Self-pay | Admitting: Adult Health

## 2021-06-22 ENCOUNTER — Telehealth (INDEPENDENT_AMBULATORY_CARE_PROVIDER_SITE_OTHER): Payer: Medicare PPO | Admitting: Adult Health

## 2021-06-22 DIAGNOSIS — F411 Generalized anxiety disorder: Secondary | ICD-10-CM

## 2021-06-22 DIAGNOSIS — G4700 Insomnia, unspecified: Secondary | ICD-10-CM | POA: Diagnosis not present

## 2021-06-22 DIAGNOSIS — F331 Major depressive disorder, recurrent, moderate: Secondary | ICD-10-CM

## 2021-06-22 MED ORDER — QUETIAPINE FUMARATE 300 MG PO TABS: ORAL_TABLET | ORAL | 3 refills | Status: DC

## 2021-06-22 MED ORDER — FLUOXETINE HCL 40 MG PO CAPS: ORAL_CAPSULE | ORAL | 3 refills | Status: DC

## 2021-06-22 MED ORDER — TOPIRAMATE 200 MG PO TABS
200.0000 mg | ORAL_TABLET | Freq: Every day | ORAL | 3 refills | Status: DC
Start: 2021-06-22 — End: 2022-06-21

## 2021-06-22 NOTE — Progress Notes (Signed)
Mitchell Webster [id number] 1970-11-10 50 y.o.  Virtual Visit via Video Note  I connected with pt @ on 06/22/21 at  1:00 PM EST by a video enabled telemedicine application and verified that I am speaking with the correct person using two identifiers.   I discussed the limitations of evaluation and management by telemedicine and the availability of in person appointments. The patient expressed understanding and agreed to proceed.  I discussed the assessment and treatment plan with the patient. The patient was provided an opportunity to ask questions and all were answered. The patient agreed with the plan and demonstrated an understanding of the instructions.   The patient was advised to call back or seek an in-person evaluation if the symptoms worsen or if the condition fails to improve as anticipated.  I provided 15 minutes of non-face-to-face time during this encounter.  The patient was located at home.  The provider was located at [facility].   Dorothyann Gibbs, NP   Subjective:   Patient ID:  Mitchell Webster is a 50 y.o. (DOB 1970-07-11) male.  Chief Complaint: No chief complaint on file.   HPI Mitchell Webster presents for follow-up of  anxiety, depression, and insomnia.  Describes mood today as "ok". Pleasant. Mood symptoms - denies depression, anxiety, and irritability. Stating "everything is good". Feels like medications continue to work well for him. Recovered from surgery on both arms. Stating "it has made a world of difference". Seeing PCP regularly. Stable interest and motivation. Taking medications as prescribed.  Energy levels alright. Active, does not have a regular exercise routine. Enjoys some usual interests and activities. Married. Lives with wife. Caretaker of wife - blind.  Appetite adequate. Weight stable - 280 pounds. Working on cholesterol levels. Sleeps well most nights. Averages 9 to 10 hours.  Focus and concentration stable. Completing tasks. Managing  aspects of household. Disabled - back/neck. Denies SI or HI.  Denies AH or VH  Review of Systems:  Review of Systems  Musculoskeletal:  Negative for gait problem.  Neurological:  Negative for tremors.  Psychiatric/Behavioral:         Please refer to HPI   Medications: I have reviewed the patient's current medications.  Current Outpatient Medications  Medication Sig Dispense Refill   albuterol (VENTOLIN HFA) 108 (90 Base) MCG/ACT inhaler INL 2 PFS ITL Q 4 H PRF WHZ     Calcium Carbonate-Simethicone (ROLAIDS PLUS GAS RELIEF EX ST PO) Take 1 tablet by mouth 2 (two) times daily as needed.     FLUoxetine (PROZAC) 40 MG capsule Take one (1) capsule 40 mg by mouth daily 90 capsule 3   HYDROcodone-acetaminophen (NORCO/VICODIN) 5-325 MG tablet TK 1 T PO Q 6 H PRN     losartan (COZAAR) 50 MG tablet      QUEtiapine (SEROQUEL) 300 MG tablet TAKE 2 TABLETS PO HS. 180 tablet 3   topiramate (TOPAMAX) 200 MG tablet Take 1 tablet (200 mg total) by mouth daily. 90 tablet 3   No current facility-administered medications for this visit.    Medication Side Effects: None  Allergies:  Allergies  Allergen Reactions   Lincomycin Hcl Itching and Hives   Azithromycin Itching   Clindamycin/Lincomycin Hives and Itching   Diphenoxylate Other (See Comments)    Unknown, childhood allergy Unknown, childhood allergy   Diphenoxylate-Atropine     Unknown, childhood allergy   Lincomycin Itching and Hives   Doxycycline Rash    Past Medical History:  Diagnosis Date   H.  pylori infection    Neuropathy    Spondylolysis     No family history on file.  Social History   Socioeconomic History   Marital status: Married    Spouse name: Not on file   Number of children: Not on file   Years of education: Not on file   Highest education level: Not on file  Occupational History   Not on file  Tobacco Use   Smoking status: Every Day    Packs/day: 0.20    Types: Cigarettes   Smokeless tobacco: Never   Substance and Sexual Activity   Alcohol use: No   Drug use: No   Sexual activity: Not on file  Other Topics Concern   Not on file  Social History Narrative   Not on file   Social Determinants of Health   Financial Resource Strain: Not on file  Food Insecurity: Not on file  Transportation Needs: Not on file  Physical Activity: Not on file  Stress: Not on file  Social Connections: Not on file  Intimate Partner Violence: Not on file    Past Medical History, Surgical history, Social history, and Family history were reviewed and updated as appropriate.   Please see review of systems for further details on the patient's review from today.   Objective:   Physical Exam:  There were no vitals taken for this visit.  Physical Exam Constitutional:      General: He is not in acute distress. Musculoskeletal:        General: No deformity.  Neurological:     Mental Status: He is alert and oriented to person, place, and time.     Coordination: Coordination normal.  Psychiatric:        Attention and Perception: Attention and perception normal. He does not perceive auditory or visual hallucinations.        Mood and Affect: Mood normal. Mood is not anxious or depressed. Affect is not labile, blunt, angry or inappropriate.        Speech: Speech normal.        Behavior: Behavior normal.        Thought Content: Thought content normal. Thought content is not paranoid or delusional. Thought content does not include homicidal or suicidal ideation. Thought content does not include homicidal or suicidal plan.        Cognition and Memory: Cognition and memory normal.        Judgment: Judgment normal.     Comments: Insight intact    Lab Review:     Component Value Date/Time   NA 138 09/20/2014 0043   K 3.5 09/20/2014 0043   CL 102 09/20/2014 0043   CO2 26 09/20/2014 0043   GLUCOSE 101 (H) 09/20/2014 0043   BUN 7 09/20/2014 0043   CREATININE 0.93 09/20/2014 0043   CALCIUM 9.8 09/20/2014  0043   PROT 7.7 09/20/2014 0043   ALBUMIN 4.5 09/20/2014 0043   AST 14 09/20/2014 0043   ALT 13 09/20/2014 0043   ALKPHOS 77 09/20/2014 0043   BILITOT 0.6 09/20/2014 0043   GFRNONAA >90 09/20/2014 0043   GFRAA >90 09/20/2014 0043       Component Value Date/Time   WBC 14.1 (H) 09/20/2014 0043   RBC 5.37 09/20/2014 0043   HGB 16.2 09/20/2014 0043   HCT 45.9 09/20/2014 0043   PLT 260 09/20/2014 0043   MCV 85.5 09/20/2014 0043   MCH 30.2 09/20/2014 0043   MCHC 35.3 09/20/2014 0043   RDW 13.2 09/20/2014  0043   LYMPHSABS 3.2 09/20/2014 0043   MONOABS 0.9 09/20/2014 0043   EOSABS 0.3 09/20/2014 0043   BASOSABS 0.0 09/20/2014 0043    No results found for: POCLITH, LITHIUM   No results found for: PHENYTOIN, PHENOBARB, VALPROATE, CBMZ   .res Assessment: Plan:    Plan:  1. Seroquel 300mg  - 2 at bedtime 2. Prozac 30mg  daily 3. Topamax 200mg  daily   RTC 6 months.  Patient advised to contact office with any questions, adverse effects, or acute worsening in signs and symptoms.  Discussed potential metabolic side effects associated with atypical antipsychotics, as well as potential risk for movement side effects. Advised pt to contact office if movement side effects occur.   There are no diagnoses linked to this encounter.   Please see After Visit Summary for patient specific instructions.  Future Appointments  Date Time Provider Department Center  06/22/2021  1:00 PM Kamia Insalaco, , NP CP-CP None    No orders of the defined types were placed in this encounter.     -------------------------------

## 2021-11-29 ENCOUNTER — Other Ambulatory Visit: Payer: Self-pay | Admitting: Adult Health

## 2021-11-29 DIAGNOSIS — F331 Major depressive disorder, recurrent, moderate: Secondary | ICD-10-CM

## 2021-11-29 DIAGNOSIS — G4700 Insomnia, unspecified: Secondary | ICD-10-CM

## 2021-12-21 ENCOUNTER — Telehealth (INDEPENDENT_AMBULATORY_CARE_PROVIDER_SITE_OTHER): Payer: Medicare PPO | Admitting: Adult Health

## 2021-12-21 ENCOUNTER — Encounter: Payer: Self-pay | Admitting: Adult Health

## 2021-12-21 DIAGNOSIS — F331 Major depressive disorder, recurrent, moderate: Secondary | ICD-10-CM | POA: Diagnosis not present

## 2021-12-21 DIAGNOSIS — G4700 Insomnia, unspecified: Secondary | ICD-10-CM | POA: Diagnosis not present

## 2021-12-21 DIAGNOSIS — F411 Generalized anxiety disorder: Secondary | ICD-10-CM

## 2021-12-21 MED ORDER — QUETIAPINE FUMARATE 300 MG PO TABS: ORAL_TABLET | ORAL | 3 refills | Status: DC

## 2021-12-21 NOTE — Progress Notes (Signed)
Mitchell Webster [id number] 1971-03-20 51 y.o.  Virtual Visit via Video Note  I connected with pt @ on 12/21/21 at  1:00 PM EDT by a video enabled telemedicine application and verified that I am speaking with the correct person using two identifiers.   I discussed the limitations of evaluation and management by telemedicine and the availability of in person appointments. The patient expressed understanding and agreed to proceed.  I discussed the assessment and treatment plan with the patient. The patient was provided an opportunity to ask questions and all were answered. The patient agreed with the plan and demonstrated an understanding of the instructions.   The patient was advised to call back or seek an in-person evaluation if the symptoms worsen or if the condition fails to improve as anticipated.  I provided 20 minutes of non-face-to-face time during this encounter.  The patient was located at home.  The provider was located at [place].   Aloha Gell, NP   Subjective:   Patient ID:  Mitchell Webster is a 51 y.o. (DOB 1970-09-16) male.  Chief Complaint: No chief complaint on file.   HPI Mitchell Webster presents for follow-up of  anxiety, depression, and insomnia.  Describes mood today as "ok". Pleasant. Mood symptoms - denies depression, anxiety, and irritability. Mood is consistent. Stating "I'm doing pretty good". Feels like medications continue to work well for him. Recovered from surgery - more use of hands. Seeing PCP regularly. Stable interest and motivation. Taking medications as prescribed.  Energy levels alright. Active, does not have a regular exercise routine. Enjoys some usual interests and activities. Married. Lives with wife of 29 years. Caretaker of wife - blind.  Appetite adequate. Weight stable - 280 pounds.  Sleeps well most nights. Averages 9 to 10 hours.  Focus and concentration stable. Completing tasks. Managing aspects of household. Disabled -  back/neck. Denies SI or HI.  Denies AH or VH   Review of Systems:  Review of Systems  Musculoskeletal:  Negative for gait problem.  Neurological:  Negative for tremors.  Psychiatric/Behavioral:         Please refer to HPI    Medications: I have reviewed the patient's current medications.  Current Outpatient Medications  Medication Sig Dispense Refill   albuterol (VENTOLIN HFA) 108 (90 Base) MCG/ACT inhaler INL 2 PFS ITL Q 4 H PRF WHZ     Calcium Carbonate-Simethicone (ROLAIDS PLUS GAS RELIEF EX ST PO) Take 1 tablet by mouth 2 (two) times daily as needed.     FLUoxetine (PROZAC) 40 MG capsule Take one (1) capsule 40 mg by mouth daily 90 capsule 3   HYDROcodone-acetaminophen (NORCO/VICODIN) 5-325 MG tablet TK 1 T PO Q 6 H PRN     losartan (COZAAR) 50 MG tablet      QUEtiapine (SEROQUEL) 300 MG tablet TAKE 2 TABLETS BY MOUTH AT BEDTIME 180 tablet 0   topiramate (TOPAMAX) 200 MG tablet Take 1 tablet (200 mg total) by mouth daily. 90 tablet 3   No current facility-administered medications for this visit.    Medication Side Effects: None  Allergies:  Allergies  Allergen Reactions   Lincomycin Hcl Itching and Hives   Azithromycin Itching   Clindamycin/Lincomycin Hives and Itching   Diphenoxylate Other (See Comments)    Unknown, childhood allergy Unknown, childhood allergy   Diphenoxylate-Atropine     Unknown, childhood allergy   Lincomycin Itching and Hives   Doxycycline Rash    Past Medical History:  Diagnosis Date  H. pylori infection    Neuropathy    Spondylolysis     No family history on file.  Social History   Socioeconomic History   Marital status: Married    Spouse name: Not on file   Number of children: Not on file   Years of education: Not on file   Highest education level: Not on file  Occupational History   Not on file  Tobacco Use   Smoking status: Every Day    Packs/day: 0.20    Types: Cigarettes   Smokeless tobacco: Never  Substance and  Sexual Activity   Alcohol use: No   Drug use: No   Sexual activity: Not on file  Other Topics Concern   Not on file  Social History Narrative   Not on file   Social Determinants of Health   Financial Resource Strain: Not on file  Food Insecurity: Not on file  Transportation Needs: Not on file  Physical Activity: Not on file  Stress: Not on file  Social Connections: Not on file  Intimate Partner Violence: Not on file    Past Medical History, Surgical history, Social history, and Family history were reviewed and updated as appropriate.   Please see review of systems for further details on the patient's review from today.   Objective:   Physical Exam:  There were no vitals taken for this visit.  Physical Exam Constitutional:      General: He is not in acute distress. Musculoskeletal:        General: No deformity.  Neurological:     Mental Status: He is alert and oriented to person, place, and time.     Coordination: Coordination normal.  Psychiatric:        Attention and Perception: Attention and perception normal. He does not perceive auditory or visual hallucinations.        Mood and Affect: Mood normal. Mood is not anxious or depressed. Affect is not labile, blunt, angry or inappropriate.        Speech: Speech normal.        Behavior: Behavior normal.        Thought Content: Thought content normal. Thought content is not paranoid or delusional. Thought content does not include homicidal or suicidal ideation. Thought content does not include homicidal or suicidal plan.        Cognition and Memory: Cognition and memory normal.        Judgment: Judgment normal.     Comments: Insight intact     Lab Review:     Component Value Date/Time   NA 138 09/20/2014 0043   K 3.5 09/20/2014 0043   CL 102 09/20/2014 0043   CO2 26 09/20/2014 0043   GLUCOSE 101 (H) 09/20/2014 0043   BUN 7 09/20/2014 0043   CREATININE 0.93 09/20/2014 0043   CALCIUM 9.8 09/20/2014 0043   PROT  7.7 09/20/2014 0043   ALBUMIN 4.5 09/20/2014 0043   AST 14 09/20/2014 0043   ALT 13 09/20/2014 0043   ALKPHOS 77 09/20/2014 0043   BILITOT 0.6 09/20/2014 0043   GFRNONAA >90 09/20/2014 0043   GFRAA >90 09/20/2014 0043       Component Value Date/Time   WBC 14.1 (H) 09/20/2014 0043   RBC 5.37 09/20/2014 0043   HGB 16.2 09/20/2014 0043   HCT 45.9 09/20/2014 0043   PLT 260 09/20/2014 0043   MCV 85.5 09/20/2014 0043   MCH 30.2 09/20/2014 0043   MCHC 35.3 09/20/2014 0043   RDW  13.2 09/20/2014 0043   LYMPHSABS 3.2 09/20/2014 0043   MONOABS 0.9 09/20/2014 0043   EOSABS 0.3 09/20/2014 0043   BASOSABS 0.0 09/20/2014 0043    No results found for: "POCLITH", "LITHIUM"   No results found for: "PHENYTOIN", "PHENOBARB", "VALPROATE", "CBMZ"   .res Assessment: Plan:    Plan:  1. Seroquel 300mg  - 2 at bedtime 2. Prozac 30mg  daily 3. Topamax 200mg  daily   RTC 6 months.  Patient advised to contact office with any questions, adverse effects, or acute worsening in signs and symptoms.  Discussed potential metabolic side effects associated with atypical antipsychotics, as well as potential risk for movement side effects. Advised pt to contact office if movement side effects occur.  There are no diagnoses linked to this encounter.   Please see After Visit Summary for patient specific instructions.  Future Appointments  Date Time Provider Department Center  12/21/2021  1:00 PM Avaley Coop, , NP CP-CP None    No orders of the defined types were placed in this encounter.     -------------------------------

## 2022-06-14 NOTE — Telephone Encounter (Signed)
Error

## 2022-06-21 ENCOUNTER — Telehealth (INDEPENDENT_AMBULATORY_CARE_PROVIDER_SITE_OTHER): Payer: Medicare PPO | Admitting: Adult Health

## 2022-06-21 ENCOUNTER — Encounter: Payer: Self-pay | Admitting: Adult Health

## 2022-06-21 DIAGNOSIS — F331 Major depressive disorder, recurrent, moderate: Secondary | ICD-10-CM | POA: Diagnosis not present

## 2022-06-21 DIAGNOSIS — G4700 Insomnia, unspecified: Secondary | ICD-10-CM | POA: Diagnosis not present

## 2022-06-21 DIAGNOSIS — F411 Generalized anxiety disorder: Secondary | ICD-10-CM

## 2022-06-21 MED ORDER — QUETIAPINE FUMARATE 300 MG PO TABS: ORAL_TABLET | ORAL | 3 refills | Status: DC

## 2022-06-21 MED ORDER — TOPIRAMATE 200 MG PO TABS: 200.0000 mg | ORAL_TABLET | Freq: Every day | ORAL | 3 refills | Status: DC

## 2022-06-21 MED ORDER — FLUOXETINE HCL 40 MG PO CAPS: ORAL_CAPSULE | ORAL | 3 refills | Status: DC

## 2022-06-21 NOTE — Progress Notes (Signed)
Mitchell Webster [id number] 07/24/1970 51 y.o.  Virtual Visit via Video Note  I connected with pt @ on 06/21/22 at  1:20 PM EST by a video enabled telemedicine application and verified that I am speaking with the correct person using two identifiers.   I discussed the limitations of evaluation and management by telemedicine and the availability of in person appointments. The patient expressed understanding and agreed to proceed.  I discussed the assessment and treatment plan with the patient. The patient was provided an opportunity to ask questions and all were answered. The patient agreed with the plan and demonstrated an understanding of the instructions.   The patient was advised to call back or seek an in-person evaluation if the symptoms worsen or if the condition fails to improve as anticipated.  I provided 15 minutes of non-face-to-face time during this encounter.  The patient was located at home.  The provider was located at [facility].   Dorothyann Gibbs, NP   Subjective:   Patient ID:  [name] is a 51 y.o. (DOB 1971/03/27) male.  Chief Complaint: No chief complaint on file.   HPI [name] presents for follow-up of anxiety, depression, and insomnia.  Describes mood today as "ok". Pleasant. Mood symptoms - denies depression, anxiety, and irritability. Mood is consistent. Stating "I feel like I'm doing pretty good". Feels like medications continue to work well for him. Stable interest and motivation. Taking medications as prescribed. Seeing PCP regularly.  Energy levels alright. Active, does not have a regular exercise routine. Enjoys some usual interests and activities. Married. Lives with wife of 29 years. Caretaker of wife - blind.  Appetite adequate. Weight loss - 272 from 280 pounds.  Sleeps well most nights. Averages 9 to 10 hours.  Focus and concentration stable. Completing tasks. Managing aspects of household. Disabled - back/neck. Denies SI or HI.   Denies AH or VH.   Review of Systems:  Review of Systems  Musculoskeletal:  Negative for gait problem.  Neurological:  Negative for tremors.  Psychiatric/Behavioral:         Please refer to HPI    Medications: I have reviewed the patient's current medications.  Current Outpatient Medications  Medication Sig Dispense Refill   albuterol (VENTOLIN HFA) 108 (90 Base) MCG/ACT inhaler INL 2 PFS ITL Q 4 H PRF WHZ     Calcium Carbonate-Simethicone (ROLAIDS PLUS GAS RELIEF EX ST PO) Take 1 tablet by mouth 2 (two) times daily as needed.     FLUoxetine (PROZAC) 40 MG capsule Take one (1) capsule 40 mg by mouth daily 90 capsule 3   HYDROcodone-acetaminophen (NORCO/VICODIN) 5-325 MG tablet TK 1 T PO Q 6 H PRN     losartan (COZAAR) 50 MG tablet      QUEtiapine (SEROQUEL) 300 MG tablet TAKE 2 TABLETS BY MOUTH AT BEDTIME 180 tablet 3   topiramate (TOPAMAX) 200 MG tablet Take 1 tablet (200 mg total) by mouth daily. 90 tablet 3   No current facility-administered medications for this visit.    Medication Side Effects: None  Allergies:  Allergies  Allergen Reactions   Lincomycin Hcl Itching and Hives   Azithromycin Itching   Clindamycin/Lincomycin Hives and Itching   Diphenoxylate Other (See Comments)    Unknown, childhood allergy Unknown, childhood allergy   Diphenoxylate-Atropine     Unknown, childhood allergy   Lincomycin Itching and Hives   Doxycycline Rash    Past Medical History:  Diagnosis Date   H. pylori infection  Neuropathy    Spondylolysis     No family history on file.  Social History   Socioeconomic History   Marital status: Married    Spouse name: Not on file   Number of children: Not on file   Years of education: Not on file   Highest education level: Not on file  Occupational History   Not on file  Tobacco Use   Smoking status: Every Day    Packs/day: 0.20    Types: Cigarettes   Smokeless tobacco: Never  Substance and Sexual Activity   Alcohol use:  No   Drug use: No   Sexual activity: Not on file  Other Topics Concern   Not on file  Social History Narrative   Not on file   Social Determinants of Health   Financial Resource Strain: Not on file  Food Insecurity: Not on file  Transportation Needs: Not on file  Physical Activity: Not on file  Stress: Not on file  Social Connections: Not on file  Intimate Partner Violence: Not on file    Past Medical History, Surgical history, Social history, and Family history were reviewed and updated as appropriate.   Please see review of systems for further details on the patient's review from today.   Objective:   Physical Exam:  There were no vitals taken for this visit.  Physical Exam Constitutional:      General: He is not in acute distress. Musculoskeletal:        General: No deformity.  Neurological:     Mental Status: He is alert and oriented to person, place, and time.     Coordination: Coordination normal.  Psychiatric:        Attention and Perception: Attention and perception normal. He does not perceive auditory or visual hallucinations.        Mood and Affect: Mood normal. Mood is not anxious or depressed. Affect is not labile, blunt, angry or inappropriate.        Speech: Speech normal.        Behavior: Behavior normal.        Thought Content: Thought content normal. Thought content is not paranoid or delusional. Thought content does not include homicidal or suicidal ideation. Thought content does not include homicidal or suicidal plan.        Cognition and Memory: Cognition and memory normal.        Judgment: Judgment normal.     Comments: Insight intact     Lab Review:     Component Value Date/Time   NA 138 09/20/2014 0043   K 3.5 09/20/2014 0043   CL 102 09/20/2014 0043   CO2 26 09/20/2014 0043   GLUCOSE 101 (H) 09/20/2014 0043   BUN 7 09/20/2014 0043   CREATININE 0.93 09/20/2014 0043   CALCIUM 9.8 09/20/2014 0043   PROT 7.7 09/20/2014 0043   ALBUMIN  4.5 09/20/2014 0043   AST 14 09/20/2014 0043   ALT 13 09/20/2014 0043   ALKPHOS 77 09/20/2014 0043   BILITOT 0.6 09/20/2014 0043   GFRNONAA >90 09/20/2014 0043   GFRAA >90 09/20/2014 0043       Component Value Date/Time   WBC 14.1 (H) 09/20/2014 0043   RBC 5.37 09/20/2014 0043   HGB 16.2 09/20/2014 0043   HCT 45.9 09/20/2014 0043   PLT 260 09/20/2014 0043   MCV 85.5 09/20/2014 0043   MCH 30.2 09/20/2014 0043   MCHC 35.3 09/20/2014 0043   RDW 13.2 09/20/2014 0043   LYMPHSABS  3.2 09/20/2014 0043   MONOABS 0.9 09/20/2014 0043   EOSABS 0.3 09/20/2014 0043   BASOSABS 0.0 09/20/2014 0043    No results found for: "POCLITH", "LITHIUM"   No results found for: "PHENYTOIN", "PHENOBARB", "VALPROATE", "CBMZ"   .res Assessment: Plan:    Plan:  1. Seroquel 300mg  - 2 at bedtime 2. Prozac 30mg  daily 3. Topamax 200mg  daily   RTC 6 months.  Patient advised to contact office with any questions, adverse effects, or acute worsening in signs and symptoms.  Discussed potential metabolic side effects associated with atypical antipsychotics, as well as potential risk for movement side effects. Advised pt to contact office if movement side effects occur.   Diagnoses and all orders for this visit:  Major depressive disorder, recurrent episode, moderate (HCC) -     FLUoxetine (PROZAC) 40 MG capsule; Take one (1) capsule 40 mg by mouth daily -     QUEtiapine (SEROQUEL) 300 MG tablet; TAKE 2 TABLETS BY MOUTH AT BEDTIME  Generalized anxiety disorder -     FLUoxetine (PROZAC) 40 MG capsule; Take one (1) capsule 40 mg by mouth daily  Insomnia, unspecified type -     QUEtiapine (SEROQUEL) 300 MG tablet; TAKE 2 TABLETS BY MOUTH AT BEDTIME -     topiramate (TOPAMAX) 200 MG tablet; Take 1 tablet (200 mg total) by mouth daily.     Please see After Visit Summary for patient specific instructions.  No future appointments.   No orders of the defined types were placed in this encounter.      -------------------------------

## 2022-07-29 ENCOUNTER — Telehealth: Payer: Self-pay | Admitting: Adult Health

## 2022-07-29 NOTE — Telephone Encounter (Signed)
Patient has been on Seroquel for years and he also has a history of GI issues. Recommended he allow 2 hours between last meal and going to bed, elevate head of bed, don't eat a heavy or big meal, drink lots of water when taking his medication.  Patient said he has eaten smaller meals and it seemed to help some. I told him if he was still having issues after initiating the recommendations that he should contact his PCP.

## 2022-07-29 NOTE — Telephone Encounter (Signed)
Pt called and said that he is experiencing side effects on the seroquel. He is having constant stomach pain and reflux. Please call him at [phone]

## 2022-12-20 ENCOUNTER — Encounter: Payer: Self-pay | Admitting: Adult Health

## 2022-12-20 ENCOUNTER — Telehealth (INDEPENDENT_AMBULATORY_CARE_PROVIDER_SITE_OTHER): Payer: Medicare PPO | Admitting: Adult Health

## 2022-12-20 DIAGNOSIS — G4700 Insomnia, unspecified: Secondary | ICD-10-CM

## 2022-12-20 DIAGNOSIS — F411 Generalized anxiety disorder: Secondary | ICD-10-CM | POA: Diagnosis not present

## 2022-12-20 DIAGNOSIS — F331 Major depressive disorder, recurrent, moderate: Secondary | ICD-10-CM | POA: Diagnosis not present

## 2022-12-20 NOTE — Progress Notes (Signed)
Mitchell Webster [id number] 02-19-1971 52 y.o.  Virtual Visit via Video Note  I connected with pt @ on 12/20/22 at  1:00 PM EDT by a video enabled telemedicine application and verified that I am speaking with the correct person using two identifiers.   I discussed the limitations of evaluation and management by telemedicine and the availability of in person appointments. The patient expressed understanding and agreed to proceed.  I discussed the assessment and treatment plan with the patient. The patient was provided an opportunity to ask questions and all were answered. The patient agreed with the plan and demonstrated an understanding of the instructions.   The patient was advised to call back or seek an in-person evaluation if the symptoms worsen or if the condition fails to improve as anticipated.  I provided 15 minutes of non-face-to-face time during this encounter.  The patient was located at home. The provider was located at [facility].   Dorothyann Gibbs, NP   Subjective:   Patient ID:  Mitchell Webster is a 52 y.o. (DOB [birth year]) male.  Chief Complaint: No chief complaint on file.   HPI [name] presents for follow-up of anxiety, depression, and insomnia.  Describes mood today as "ok". Pleasant. Mood symptoms - denies depression, anxiety, and irritability. Mood is consistent. Stating "I feel like I'm doing ok". Feels like medications continue to work well for him. Stable interest and motivation. He and wife doing well. Taking medications as prescribed. Seeing PCP regularly.  Energy levels alright. Active, does not have a regular exercise routine. Enjoys some usual interests and activities. Married. Lives with wife of 29 years. Caretaker of wife - blind.  Appetite adequate. Weight gain - 272 to 275 pounds.  Sleeps well most nights. Averages 9 to 10 hours. Reports some daytime napping. Focus and concentration stable. Completing tasks. Managing aspects of household.  Disabled - back/neck. Denies SI or HI.  Denies AH or VH.    Review of Systems:  Review of Systems  Musculoskeletal:  Negative for gait problem.  Neurological:  Negative for tremors.  Psychiatric/Behavioral:         Please refer to HPI    Medications: I have reviewed the patient's current medications.  Current Outpatient Medications  Medication Sig Dispense Refill   albuterol (VENTOLIN HFA) 108 (90 Base) MCG/ACT inhaler INL 2 PFS ITL Q 4 H PRF WHZ     Calcium Carbonate-Simethicone (ROLAIDS PLUS GAS RELIEF EX ST PO) Take 1 tablet by mouth 2 (two) times daily as needed.     FLUoxetine (PROZAC) 40 MG capsule Take one (1) capsule 40 mg by mouth daily 90 capsule 3   HYDROcodone-acetaminophen (NORCO/VICODIN) 5-325 MG tablet TK 1 T PO Q 6 H PRN     losartan (COZAAR) 50 MG tablet      QUEtiapine (SEROQUEL) 300 MG tablet TAKE 2 TABLETS BY MOUTH AT BEDTIME 180 tablet 3   topiramate (TOPAMAX) 200 MG tablet Take 1 tablet (200 mg total) by mouth daily. 90 tablet 3   No current facility-administered medications for this visit.    Medication Side Effects: None  Allergies:  Allergies  Allergen Reactions   Lincomycin Hcl Itching and Hives   Azithromycin Itching   Clindamycin/Lincomycin Hives and Itching   Diphenoxylate Other (See Comments)    Unknown, childhood allergy Unknown, childhood allergy   Diphenoxylate-Atropine     Unknown, childhood allergy   Lincomycin Itching and Hives   Doxycycline Rash    Past Medical History:  Diagnosis Date   H. pylori infection    Neuropathy    Spondylolysis     No family history on file.  Social History   Socioeconomic History   Marital status: Married    Spouse name: Not on file   Number of children: Not on file   Years of education: Not on file   Highest education level: Not on file  Occupational History   Not on file  Tobacco Use   Smoking status: Every Day    Packs/day: .2    Types: Cigarettes   Smokeless tobacco: Never   Substance and Sexual Activity   Alcohol use: No   Drug use: No   Sexual activity: Not on file  Other Topics Concern   Not on file  Social History Narrative   Not on file   Social Determinants of Health   Financial Resource Strain: Not on file  Food Insecurity: Not on file  Transportation Needs: Not on file  Physical Activity: Not on file  Stress: Not on file  Social Connections: Not on file  Intimate Partner Violence: Not on file    Past Medical History, Surgical history, Social history, and Family history were reviewed and updated as appropriate.   Please see review of systems for further details on the patient's review from today.   Objective:   Physical Exam:  There were no vitals taken for this visit.  Physical Exam Constitutional:      General: He is not in acute distress. Musculoskeletal:        General: No deformity.  Neurological:     Mental Status: He is alert and oriented to person, place, and time.     Coordination: Coordination normal.  Psychiatric:        Attention and Perception: Attention and perception normal. He does not perceive auditory or visual hallucinations.        Mood and Affect: Mood normal. Mood is not anxious or depressed. Affect is not labile, blunt, angry or inappropriate.        Speech: Speech normal.        Behavior: Behavior normal.        Thought Content: Thought content normal. Thought content is not paranoid or delusional. Thought content does not include homicidal or suicidal ideation. Thought content does not include homicidal or suicidal plan.        Cognition and Memory: Cognition and memory normal.        Judgment: Judgment normal.     Comments: Insight intact     Lab Review:     Component Value Date/Time   NA 138 09/20/2014 0043   K 3.5 09/20/2014 0043   CL 102 09/20/2014 0043   CO2 26 09/20/2014 0043   GLUCOSE 101 (H) 09/20/2014 0043   BUN 7 09/20/2014 0043   CREATININE 0.93 09/20/2014 0043   CALCIUM 9.8  09/20/2014 0043   PROT 7.7 09/20/2014 0043   ALBUMIN 4.5 09/20/2014 0043   AST 14 09/20/2014 0043   ALT 13 09/20/2014 0043   ALKPHOS 77 09/20/2014 0043   BILITOT 0.6 09/20/2014 0043   GFRNONAA >90 09/20/2014 0043   GFRAA >90 09/20/2014 0043       Component Value Date/Time   WBC 14.1 (H) 09/20/2014 0043   RBC 5.37 09/20/2014 0043   HGB 16.2 09/20/2014 0043   HCT 45.9 09/20/2014 0043   PLT 260 09/20/2014 0043   MCV 85.5 09/20/2014 0043   MCH 30.2 09/20/2014 0043   MCHC 35.3 09/20/2014  0043   RDW 13.2 09/20/2014 0043   LYMPHSABS 3.2 09/20/2014 0043   MONOABS 0.9 09/20/2014 0043   EOSABS 0.3 09/20/2014 0043   BASOSABS 0.0 09/20/2014 0043    No results found for: "POCLITH", "LITHIUM"   No results found for: "PHENYTOIN", "PHENOBARB", "VALPROATE", "CBMZ"   .res Assessment: Plan:    Plan:  1. Seroquel 300mg  - 2 at bedtime 2. Prozac 30mg  daily 3. Topamax 200mg  daily   RTC 6 months.  Patient advised to contact office with any questions, adverse effects, or acute worsening in signs and symptoms.  Discussed potential metabolic side effects associated with atypical antipsychotics, as well as potential risk for movement side effects. Advised pt to contact office if movement side effects occur.   Diagnoses and all orders for this visit:  Major depressive disorder, recurrent episode, moderate (HCC)  Generalized anxiety disorder  Insomnia, unspecified type     Please see After Visit Summary for patient specific instructions.  No future appointments.   No orders of the defined types were placed in this encounter.     -------------------------------

## 2023-05-28 ENCOUNTER — Encounter: Payer: Self-pay | Admitting: Adult Health

## 2023-05-28 ENCOUNTER — Telehealth: Payer: Medicare PPO | Admitting: Adult Health

## 2023-05-28 DIAGNOSIS — F419 Anxiety disorder, unspecified: Secondary | ICD-10-CM

## 2023-05-28 DIAGNOSIS — F331 Major depressive disorder, recurrent, moderate: Secondary | ICD-10-CM

## 2023-05-28 DIAGNOSIS — F411 Generalized anxiety disorder: Secondary | ICD-10-CM

## 2023-05-28 DIAGNOSIS — G4700 Insomnia, unspecified: Secondary | ICD-10-CM | POA: Diagnosis not present

## 2023-05-28 DIAGNOSIS — F32A Depression, unspecified: Secondary | ICD-10-CM | POA: Diagnosis not present

## 2023-05-28 MED ORDER — FLUOXETINE HCL 40 MG PO CAPS: ORAL_CAPSULE | ORAL | 3 refills | Status: DC

## 2023-05-28 MED ORDER — TOPIRAMATE 200 MG PO TABS: 200.0000 mg | ORAL_TABLET | Freq: Every day | ORAL | 3 refills | Status: DC

## 2023-05-28 MED ORDER — QUETIAPINE FUMARATE 300 MG PO TABS: ORAL_TABLET | ORAL | 3 refills | Status: DC

## 2023-05-28 NOTE — Progress Notes (Signed)
Mitchell Webster [id number] December 30, 1970 52 y.o.  Virtual Visit via Video Note  I connected with pt @ on 05/28/23 at  1:00 PM EST by a video enabled telemedicine application and verified that I am speaking with the correct person using two identifiers.   I discussed the limitations of evaluation and management by telemedicine and the availability of in person appointments. The patient expressed understanding and agreed to proceed.  I discussed the assessment and treatment plan with the patient. The patient was provided an opportunity to ask questions and all were answered. The patient agreed with the plan and demonstrated an understanding of the instructions.   The patient was advised to call back or seek an in-person evaluation if the symptoms worsen or if the condition fails to improve as anticipated.  I provided 15 minutes of non-face-to-face time during this encounter.  The patient was located at home.  The provider was located at [facility].   Dorothyann Gibbs, NP   Subjective:   Patient ID:  Mitchell Webster is a 52 y.o. (DOB 02-01-1971) male.  Chief Complaint: No chief complaint on file.   HPI Mitchell Webster presents for follow-up of anxiety, depression, and insomnia.  Describes mood today as "ok". Pleasant. Mood symptoms - denies depression, anxiety, and irritability. Denies panic attacks. Denies worry, rumination, and over thinking. Mood is consistent. Stating "I feel like things are going good". Feels like medications continue to work well for him. Stable interest and motivation. He and wife doing well. Taking medications as prescribed. Seeing PCP regularly.  Energy levels alright. Active, has a regular exercise routine - walking every day on the treadmill. Enjoys some usual interests and activities. Married. Lives with wife. Caretaker of wife - blind.  Appetite adequate. Weight loss 279 pounds.  Sleeps well most nights. Averages 9 to 10 hours. Reports some daytime  napping. Focus and concentration stable. Completing tasks. Managing aspects of household. Disabled - back/neck. Denies SI or HI.  Denies AH or VH. Denies self harm. Denies substance use.  Review of Systems:  Review of Systems  Musculoskeletal:  Negative for gait problem.  Neurological:  Negative for tremors.  Psychiatric/Behavioral:         Please refer to HPI    Medications: I have reviewed the patient's current medications.  Current Outpatient Medications  Medication Sig Dispense Refill   albuterol (VENTOLIN HFA) 108 (90 Base) MCG/ACT inhaler INL 2 PFS ITL Q 4 H PRF WHZ     Calcium Carbonate-Simethicone (ROLAIDS PLUS GAS RELIEF EX ST PO) Take 1 tablet by mouth 2 (two) times daily as needed.     FLUoxetine (PROZAC) 40 MG capsule Take one (1) capsule 40 mg by mouth daily 90 capsule 3   HYDROcodone-acetaminophen (NORCO/VICODIN) 5-325 MG tablet TK 1 T PO Q 6 H PRN     losartan (COZAAR) 50 MG tablet      QUEtiapine (SEROQUEL) 300 MG tablet TAKE 2 TABLETS BY MOUTH AT BEDTIME 180 tablet 3   topiramate (TOPAMAX) 200 MG tablet Take 1 tablet (200 mg total) by mouth daily. 90 tablet 3   No current facility-administered medications for this visit.    Medication Side Effects: None  Allergies:  Allergies  Allergen Reactions   Lincomycin Hcl Itching and Hives   Azithromycin Itching   Clindamycin/Lincomycin Hives and Itching   Diphenoxylate Other (See Comments)    Unknown, childhood allergy Unknown, childhood allergy   Diphenoxylate-Atropine     Unknown, childhood allergy   Lincomycin  Itching and Hives   Doxycycline Rash    Past Medical History:  Diagnosis Date   H. pylori infection    Neuropathy    Spondylolysis     No family history on file.  Social History   Socioeconomic History   Marital status: Married    Spouse name: Not on file   Number of children: Not on file   Years of education: Not on file   Highest education level: Not on file  Occupational History    Not on file  Tobacco Use   Smoking status: Every Day    Current packs/day: 0.20    Types: Cigarettes   Smokeless tobacco: Never  Substance and Sexual Activity   Alcohol use: No   Drug use: No   Sexual activity: Not on file  Other Topics Concern   Not on file  Social History Narrative   Not on file   Social Determinants of Health   Financial Resource Strain: Unknown (05/21/2021)   [facility] prior to 08/24/2022., [facility] visits prior to 08/24/2022.   Overall Financial Resource Strain (CARDIA)    Difficulty of Paying Living Expenses: Patient refused  Food Insecurity: Low Risk  (05/07/2023)   [facility]   Hunger Vital Sign    Worried About Running Out of Food in the Last Year: Never true    Ran Out of Food in the Last Year: Never true  Transportation Needs: No Transportation Needs (05/07/2023)   Received from [facility]    In the past 12 months, has lack of reliable transportation kept you from medical appointments, meetings, work or from getting things needed for daily living? : No  Physical Activity: Unknown (05/21/2021)   [facility] prior to 08/24/2022., [facility] visits prior to 08/24/2022.   Exercise Vital Sign    Days of Exercise per Week: Patient refused    Minutes of Exercise per Session: Not on file  Stress: Unknown (05/21/2021)   [facility] prior to 08/24/2022., [facility] visits prior to 08/24/2022.   [employer] of Occupational Health - Occupational Stress Questionnaire    Feeling of Stress : Patient refused  Social Connections: Unknown (05/21/2021)   [facility] prior to 08/24/2022., [facility] visits prior to 08/24/2022.   Social Connection and Isolation Panel [NHANES]     Frequency of Communication with Friends and Family: Patient refused    Frequency of Social Gatherings with Friends and Family: Patient refused    Attends Religious Services: Patient refused    Active Member of Clubs or Organizations: Patient refused    Attends Banker Meetings: Patient refused    Marital Status: Patient refused  Intimate Partner Violence: Unknown (05/21/2021)   [facility] prior to 08/24/2022., [facility] visits prior to 08/24/2022.   Humiliation, Afraid, Rape, and Kick questionnaire    Fear of Current or Ex-Partner: Patient refused    Emotionally Abused: Patient refused    Physically Abused: Patient refused    Sexually Abused: Patient refused    Past Medical History, Surgical history, Social history, and Family history were reviewed and updated as appropriate.   Please see review of systems for further details on the patient's review from today.  Objective:   Physical Exam:  There were no vitals taken for this visit.  Physical Exam Constitutional:      General: He is not in acute distress. Musculoskeletal:        General: No deformity.  Neurological:     Mental Status: He is alert and oriented to person, place, and time.     Coordination: Coordination normal.  Psychiatric:        Attention and Perception: Attention and perception normal. He does not perceive auditory or visual hallucinations.        Mood and Affect: Affect is not labile, blunt, angry or inappropriate.        Speech: Speech normal.        Behavior: Behavior normal.        Thought Content: Thought content normal. Thought content is not paranoid or delusional. Thought content does not include homicidal or suicidal ideation. Thought content does not include homicidal or suicidal plan.        Cognition and Memory: Cognition and memory normal.        Judgment: Judgment normal.     Comments: Insight intact     Lab  Review:     Component Value Date/Time   NA 138 09/20/2014 0043   K 3.5 09/20/2014 0043   CL 102 09/20/2014 0043   CO2 26 09/20/2014 0043   GLUCOSE 101 (H) 09/20/2014 0043   BUN 7 09/20/2014 0043   CREATININE 0.93 09/20/2014 0043   CALCIUM 9.8 09/20/2014 0043   PROT 7.7 09/20/2014 0043   ALBUMIN 4.5 09/20/2014 0043   AST 14 09/20/2014 0043   ALT 13 09/20/2014 0043   ALKPHOS 77 09/20/2014 0043   BILITOT 0.6 09/20/2014 0043   GFRNONAA >90 09/20/2014 0043   GFRAA >90 09/20/2014 0043       Component Value Date/Time   WBC 14.1 (H) 09/20/2014 0043   RBC 5.37 09/20/2014 0043   HGB 16.2 09/20/2014 0043   HCT 45.9 09/20/2014 0043   PLT 260 09/20/2014 0043   MCV 85.5 09/20/2014 0043   MCH 30.2 09/20/2014 0043   MCHC 35.3 09/20/2014 0043   RDW 13.2 09/20/2014 0043   LYMPHSABS 3.2 09/20/2014 0043   MONOABS 0.9 09/20/2014 0043   EOSABS 0.3 09/20/2014 0043   BASOSABS 0.0 09/20/2014 0043    No results found for: "POCLITH", "LITHIUM"   No results found for: "PHENYTOIN", "PHENOBARB", "VALPROATE", "CBMZ"   .res Assessment: Plan:    Plan:  1. Seroquel 300mg  - 2 at bedtime 2. Prozac 30mg  daily 3. Topamax 200mg  daily   RTC 6 months.  Patient advised to contact office with any questions, adverse effects, or acute worsening in signs and symptoms.  Discussed potential metabolic side effects associated with atypical antipsychotics, as well as potential risk for movement side effects. Advised pt to contact office if movement side effects occur.   There are no diagnoses linked to this encounter.   Please see After Visit Summary for patient specific instructions.  Future Appointments  Date Time Provider Department Center  05/28/2023  1:00 PM Cheral Cappucci, Thereasa Solo, NP CP-CP None    No orders of the defined types were placed in this encounter.     -------------------------------

## 2023-12-09 ENCOUNTER — Telehealth: Payer: Medicare PPO | Admitting: Adult Health

## 2024-01-01 ENCOUNTER — Encounter: Payer: Self-pay | Admitting: Adult Health

## 2024-01-01 ENCOUNTER — Telehealth: Admitting: Adult Health

## 2024-01-01 DIAGNOSIS — F411 Generalized anxiety disorder: Secondary | ICD-10-CM

## 2024-01-01 DIAGNOSIS — F32A Depression, unspecified: Secondary | ICD-10-CM

## 2024-01-01 DIAGNOSIS — F419 Anxiety disorder, unspecified: Secondary | ICD-10-CM | POA: Diagnosis not present

## 2024-01-01 DIAGNOSIS — G4700 Insomnia, unspecified: Secondary | ICD-10-CM

## 2024-01-01 DIAGNOSIS — F331 Major depressive disorder, recurrent, moderate: Secondary | ICD-10-CM

## 2024-01-01 MED ORDER — FLUOXETINE HCL 40 MG PO CAPS: ORAL_CAPSULE | ORAL | 3 refills | Status: AC

## 2024-01-01 MED ORDER — TOPIRAMATE 200 MG PO TABS: 200.0000 mg | ORAL_TABLET | Freq: Every day | ORAL | 3 refills | Status: AC

## 2024-01-01 MED ORDER — QUETIAPINE FUMARATE 300 MG PO TABS: ORAL_TABLET | ORAL | 3 refills | Status: DC

## 2024-01-01 NOTE — Progress Notes (Addendum)
 Mitchell Webster [id number] 06-Mar-1971 53 y.o.  Virtual Visit via Video Note  I connected with pt @ on 01/01/24 at  1:00 PM EDT by a video enabled telemedicine application and verified that I am speaking with the correct person using two identifiers.   I discussed the limitations of evaluation and management by telemedicine and the availability of in person appointments. The patient expressed understanding and agreed to proceed.  I discussed the assessment and treatment plan with the patient. The patient was provided an opportunity to ask questions and all were answered. The patient agreed with the plan and demonstrated an understanding of the instructions.   The patient was advised to call back or seek an in-person evaluation if the symptoms worsen or if the condition fails to improve as anticipated.  I provided 15 minutes of non-face-to-face time during this encounter.  The patient was located at home.  The provider was located at [facility].   Angeline LOISE Sayers, NP   Subjective:   Patient ID:  Mitchell Webster is a 53 y.o. (DOB [birth year]) male.  Chief Complaint: No chief complaint on file.   HPI Mitchell Webster presents for follow-up of anxiety, depression, and insomnia.  Describes mood today as ok. Pleasant. Mood symptoms - denies depression, anxiety and irritability. Reports stable interest and motivation. Denies panic attacks. Denies worry, rumination, and over thinking. Reports mood is stable. Stating I feel like I'm doing alright. Feels like medications continue to work well for him. Taking medications as prescribed. Seeing PCP regularly.  Energy levels alright. Active, has a regular exercise routine - walking every day on the treadmill. Enjoys some usual interests and activities. Married. Lives with wife. Caretaker of wife - blind.  Appetite adequate. Weight loss 269 from 279 pounds.  Sleeps well most nights. Averages 9 to 10 hours. Reports some daytime napping. Focus  and concentration stable. Completing tasks. Managing aspects of household. Disabled - back/neck. Denies SI or HI.  Denies AH or VH. Denies self harm. Denies substance use.   Review of Systems:  Review of Systems  Musculoskeletal:  Negative for gait problem.  Neurological:  Negative for tremors.  Psychiatric/Behavioral:         Please refer to HPI    Medications: I have reviewed the patient's current medications.  Current Outpatient Medications  Medication Sig Dispense Refill   albuterol (VENTOLIN HFA) 108 (90 Base) MCG/ACT inhaler INL 2 PFS ITL Q 4 H PRF WHZ     Calcium Carbonate-Simethicone  (ROLAIDS PLUS GAS RELIEF EX ST PO) Take 1 tablet by mouth 2 (two) times daily as needed.     FLUoxetine  (PROZAC ) 40 MG capsule Take one (1) capsule 40 mg by mouth daily 90 capsule 3   HYDROcodone-acetaminophen (NORCO/VICODIN) 5-325 MG tablet TK 1 T PO Q 6 H PRN     losartan (COZAAR) 50 MG tablet      QUEtiapine  (SEROQUEL ) 300 MG tablet TAKE 2 TABLETS BY MOUTH AT BEDTIME 180 tablet 3   topiramate  (TOPAMAX ) 200 MG tablet Take 1 tablet (200 mg total) by mouth daily. 90 tablet 3   No current facility-administered medications for this visit.    Medication Side Effects: None  Allergies:  Allergies  Allergen Reactions   Lincomycin Hcl Itching and Hives   Azithromycin Itching   Clindamycin/Lincomycin Hives and Itching   Diphenoxylate Other (See Comments)    Unknown, childhood allergy Unknown, childhood allergy   Diphenoxylate-Atropine     Unknown, childhood allergy   Lincomycin Itching  and Hives   Doxycycline Rash    Past Medical History:  Diagnosis Date   H. pylori infection    Neuropathy    Spondylolysis     No family history on file.  Social History   Socioeconomic History   Marital status: Married    Spouse name: Not on file   Number of children: Not on file   Years of education: Not on file   Highest education level: Not on file  Occupational History   Not on file   Tobacco Use   Smoking status: Every Day    Current packs/day: 0.20    Types: Cigarettes   Smokeless tobacco: Never  Substance and Sexual Activity   Alcohol use: No   Drug use: No   Sexual activity: Not on file  Other Topics Concern   Not on file  Social History Narrative   Not on file   Social Drivers of Health   Financial Resource Strain: Unknown (05/21/2021)   [facility] prior to 08/24/2022.   Overall Financial Resource Strain (CARDIA)    Difficulty of Paying Living Expenses: Patient refused  Food Insecurity: Low Risk  (12/04/2023)   [facility]   Hunger Vital Sign    Within the past 12 months, you worried that your food would run out before you got money to buy more: Never true    Within the past 12 months, the food you bought just didn't last and you didn't have money to get more. : Never true  Transportation Needs: No Transportation Needs (12/04/2023)   Received from [facility]    In the past 12 months, has lack of reliable transportation kept you from medical appointments, meetings, work or from getting things needed for daily living? : No  Physical Activity: Unknown (05/21/2021)   [facility] visits prior to 08/24/2022.   Exercise Vital Sign    On average, how many days per week do you engage in moderate to strenuous exercise (like a brisk walk)?: Patient refused    Minutes of Exercise per Session: Not on file  Stress: Unknown (05/21/2021)   [facility] prior to 08/24/2022.   [employer] of Occupational Health - Occupational Stress Questionnaire    Feeling of Stress : Patient refused  Social Connections: Unknown (05/21/2021)   [facility] visits prior to 08/24/2022.   Social Connection and Isolation Panel    In a typical week, how many times do you talk on the phone with family,  friends, or neighbors?: Patient refused    How often do you get together with friends or relatives?: Patient refused    How often do you attend church or religious services?: Patient refused    Do you belong to any clubs or organizations such as church groups, unions, fraternal or athletic groups, or school groups?: Patient refused    How often do you attend meetings of the clubs or organizations you belong to?: Patient refused    Are you married, widowed, divorced, separated, never married, or living with a partner?: Patient refused  Intimate Partner Violence: Unknown (05/21/2021)   [facility] visits prior to 08/24/2022.   Humiliation, Afraid, Rape, and Kick questionnaire    Within the last year, have you been afraid of your partner or ex-partner?: Patient refused    Within the last year,  have you been humiliated or emotionally abused in other ways by your partner or ex-partner?: Patient refused    Within the last year, have you been kicked, hit, slapped, or otherwise physically hurt by your partner or ex-partner?: Patient refused    Within the last year, have you been raped or forced to have any kind of sexual activity by your partner or ex-partner?: Patient refused    Past Medical History, Surgical history, Social history, and Family history were reviewed and updated as appropriate.   Please see review of systems for further details on the patient's review from today.   Objective:   Physical Exam:  There were no vitals taken for this visit.  Physical Exam Constitutional:      General: He is not in acute distress. Musculoskeletal:        General: No deformity.  Neurological:     Mental Status: He is alert and oriented to person, place, and time.     Coordination: Coordination normal.  Psychiatric:        Attention and Perception: Attention and perception normal. He does not perceive auditory or visual hallucinations.        Mood and Affect: Mood  normal. Mood is not anxious or depressed. Affect is not labile, blunt, angry or inappropriate.        Speech: Speech normal.        Behavior: Behavior normal.        Thought Content: Thought content normal. Thought content is not paranoid or delusional. Thought content does not include homicidal or suicidal ideation. Thought content does not include homicidal or suicidal plan.        Cognition and Memory: Cognition and memory normal.        Judgment: Judgment normal.     Comments: Insight intact     Lab Review:     Component Value Date/Time   NA 138 09/20/2014 0043   K 3.5 09/20/2014 0043   CL 102 09/20/2014 0043   CO2 26 09/20/2014 0043   GLUCOSE 101 (H) 09/20/2014 0043   BUN 7 09/20/2014 0043   CREATININE 0.93 09/20/2014 0043   CALCIUM 9.8 09/20/2014 0043   PROT 7.7 09/20/2014 0043   ALBUMIN 4.5 09/20/2014 0043   AST 14 09/20/2014 0043   ALT 13 09/20/2014 0043   ALKPHOS 77 09/20/2014 0043   BILITOT 0.6 09/20/2014 0043   GFRNONAA >90 09/20/2014 0043   GFRAA >90 09/20/2014 0043       Component Value Date/Time   WBC 14.1 (H) 09/20/2014 0043   RBC 5.37 09/20/2014 0043   HGB 16.2 09/20/2014 0043   HCT 45.9 09/20/2014 0043   PLT 260 09/20/2014 0043   MCV 85.5 09/20/2014 0043   MCH 30.2 09/20/2014 0043   MCHC 35.3 09/20/2014 0043   RDW 13.2 09/20/2014 0043   LYMPHSABS 3.2 09/20/2014 0043   MONOABS 0.9 09/20/2014 0043   EOSABS 0.3 09/20/2014 0043   BASOSABS 0.0 09/20/2014 0043    No results found for: POCLITH, LITHIUM   No results found for: PHENYTOIN, PHENOBARB, VALPROATE, CBMZ   .res Assessment: Plan:    Plan:  1. Seroquel  300mg  - 2 at bedtime 2. Prozac  30mg  daily 3. Topamax  200mg  daily   RTC 6 months.  15 minutes spent dedicated to the care of this patient on the date of this encounter to include pre-visit review of records, ordering of medication, post visit documentation, and face-to-face time with the patient discussing depression, anxiety  and insomnia. Discussed continuing  current medication regimen.  Patient advised to contact office with any questions, adverse effects, or acute worsening in signs and symptoms.  Discussed potential metabolic side effects associated with atypical antipsychotics, as well as potential risk for movement side effects. Advised pt to contact office if movement side effects occur.   There are no diagnoses linked to this encounter.   Please see After Visit Summary for patient specific instructions.  Future Appointments  Date Time Provider Department Center  01/01/2024  1:00 PM Tieisha Darden Nattalie, NP CP-CP None    No orders of the defined types were placed in this encounter.     -------------------------------

## 2024-05-26 ENCOUNTER — Telehealth: Payer: Self-pay | Admitting: Adult Health

## 2024-05-26 DIAGNOSIS — G4700 Insomnia, unspecified: Secondary | ICD-10-CM

## 2024-05-26 DIAGNOSIS — F331 Major depressive disorder, recurrent, moderate: Secondary | ICD-10-CM

## 2024-05-26 MED ORDER — QUETIAPINE FUMARATE 300 MG PO TABS: ORAL_TABLET | ORAL | 0 refills | Status: DC

## 2024-05-26 NOTE — Telephone Encounter (Signed)
 Sent RF to requested pharmacy.

## 2024-05-26 NOTE — Telephone Encounter (Signed)
  Pt states his Quetiapine  is not avail at his pharmacy and he found another pharmacy that does and wants it sent there.     [facility] [address] [place]

## 2024-07-05 ENCOUNTER — Telehealth: Admitting: Adult Health

## 2024-07-15 ENCOUNTER — Encounter: Payer: Self-pay | Admitting: Adult Health

## 2024-07-15 ENCOUNTER — Telehealth: Admitting: Adult Health

## 2024-07-15 DIAGNOSIS — F331 Major depressive disorder, recurrent, moderate: Secondary | ICD-10-CM

## 2024-07-15 DIAGNOSIS — G4700 Insomnia, unspecified: Secondary | ICD-10-CM

## 2024-07-15 DIAGNOSIS — F32A Depression, unspecified: Secondary | ICD-10-CM

## 2024-07-15 DIAGNOSIS — F419 Anxiety disorder, unspecified: Secondary | ICD-10-CM

## 2024-07-15 MED ORDER — QUETIAPINE FUMARATE 300 MG PO TABS: ORAL_TABLET | ORAL | 0 refills | Status: DC

## 2024-07-15 MED ORDER — QUETIAPINE FUMARATE 300 MG PO TABS: ORAL_TABLET | ORAL | 1 refills | Status: AC

## 2024-07-15 NOTE — Progress Notes (Signed)
 Mitchell Webster [id number] 1970-09-23 54 y.o.  Virtual Visit via Video Note  I connected with pt @ on 07/15/24 at  3:30 PM EST by a video enabled telemedicine application and verified that I am speaking with the correct person using two identifiers.   I discussed the limitations of evaluation and management by telemedicine and the availability of in person appointments. The patient expressed understanding and agreed to proceed.  I discussed the assessment and treatment plan with the patient. The patient was provided an opportunity to ask questions and all were answered. The patient agreed with the plan and demonstrated an understanding of the instructions.   The patient was advised to call back or seek an in-person evaluation if the symptoms worsen or if the condition fails to improve as anticipated.  I provided 15 minutes of non-face-to-face time during this encounter.  The patient was located at home.  The provider was located at [facility].   Angeline LOISE Sayers, NP   Subjective:   Patient ID:  Mitchell Webster is a 54 y.o. (DOB 1970/12/04) male.  Chief Complaint: No chief complaint on file.   HPI Mitchell Webster presents for follow-up of anxiety, depression, and insomnia.  Describes mood today as ok. Pleasant. Mood symptoms - denies depression, anxiety and irritability. Reports stable interest and motivation. Denies panic attacks. Denies worry, rumination, and over thinking. Reports mood is stable. Stating I feel like I'm doing ok. Reports medications continue to work well for him. Taking medications as prescribed. Seeing PCP regularly.  Energy levels alright. Active, has a regular exercise routine - 6 days a week - walking on the treadmill every day. Enjoys some usual interests and activities. Married. Lives with wife. Caretaker of wife - blind.  Appetite adequate. Weight loss 269 pounds.  Sleeps well most nights. Averages 9 to 10 hours. Reports some daytime napping. Focus and  concentration stable. Completing tasks. Managing aspects of household. Disabled - back/neck. Denies SI or HI.  Denies AH or VH. Denies self harm. Denies substance use.  Review of Systems:  Review of Systems  Musculoskeletal:  Negative for gait problem.  Neurological:  Negative for tremors.  Psychiatric/Behavioral:         Please refer to HPI    Medications: I have reviewed the patient's current medications.  Current Outpatient Medications  Medication Sig Dispense Refill   albuterol (VENTOLIN HFA) 108 (90 Base) MCG/ACT inhaler INL 2 PFS ITL Q 4 H PRF WHZ     Calcium Carbonate-Simethicone  (ROLAIDS PLUS GAS RELIEF EX ST PO) Take 1 tablet by mouth 2 (two) times daily as needed.     FLUoxetine  (PROZAC ) 40 MG capsule Take one (1) capsule 40 mg by mouth daily 90 capsule 3   HYDROcodone-acetaminophen (NORCO/VICODIN) 5-325 MG tablet TK 1 T PO Q 6 H PRN     losartan (COZAAR) 50 MG tablet      QUEtiapine  (SEROQUEL ) 300 MG tablet TAKE 2 TABLETS BY MOUTH AT BEDTIME 180 tablet 0   topiramate  (TOPAMAX ) 200 MG tablet Take 1 tablet (200 mg total) by mouth daily. 90 tablet 3   No current facility-administered medications for this visit.    Medication Side Effects: None  Allergies: Allergies[1]  Past Medical History:  Diagnosis Date   H. pylori infection    Neuropathy    Spondylolysis     No family history on file.  Social History   Socioeconomic History   Marital status: Married    Spouse name: Not on file  Number of children: Not on file   Years of education: Not on file   Highest education level: Not on file  Occupational History   Not on file  Tobacco Use   Smoking status: Every Day    Current packs/day: 0.20    Types: Cigarettes   Smokeless tobacco: Never  Substance and Sexual Activity   Alcohol use: No   Drug use: No   Sexual activity: Not on file  Other Topics Concern   Not on file  Social History Narrative   Not on file   Social Drivers of Health   Tobacco  Use: Medium Risk (06/07/2024)   Received from [facility]   Patient History    Smoking Tobacco Use: Former    Smokeless Tobacco Use: Never    Passive Exposure: Never  Physicist, Medical Strain: Not on file  Food Insecurity: Low Risk (06/06/2024)   Received from [facility]   Epic    Within the past 12 months, you worried that your food would run out before you got money to buy more: Never true    Within the past 12 months, the food you bought just didn't last and you didn't have money to get more. : Never true  Transportation Needs: No Transportation Needs (06/06/2024)   Received from [facility]    In the past 12 months, has lack of reliable transportation kept you from medical appointments, meetings, work or from getting things needed for daily living? : No  Physical Activity: Not on file  Stress: Not on file  Social Connections: Not on file  Intimate Partner Violence: Not on file  Depression (EYV7-0): Not on file  Alcohol Screen: Not on file  Housing: Low Risk (06/06/2024)   Received from [facility]   Epic    What is your living situation today?: I have a steady place to live    Think about the place you live. Do you have problems with any of the following? Choose all that apply:: None/None on this list  Utilities: Low Risk (06/06/2024)   Received from [facility]   Utilities    In the past 12 months has the electric, gas, oil, or water company threatened to shut off services in your home? : No  Health Literacy: Not on file    Past Medical History, Surgical history, Social history, and Family history were reviewed and updated as appropriate.   Please see review of systems for further details on the patient's review from today.   Objective:   Physical Exam:  There were no vitals taken for this visit.  Physical Exam Constitutional:      General: He is not in acute distress. Musculoskeletal:        General: No deformity.  Neurological:      Mental Status: He is alert and oriented to person, place, and time.     Coordination: Coordination normal.  Psychiatric:        Attention and Perception: Attention and perception normal. He does not perceive auditory or visual hallucinations.        Mood and Affect: Mood normal. Mood is not anxious or depressed. Affect is not labile, blunt, angry or inappropriate.        Speech: Speech normal.        Behavior: Behavior normal.        Thought Content: Thought content normal. Thought content is not paranoid or delusional. Thought content does not include homicidal or suicidal ideation. Thought content  does not include homicidal or suicidal plan.        Cognition and Memory: Cognition and memory normal.        Judgment: Judgment normal.     Comments: Insight intact     Lab Review:     Component Value Date/Time   NA 138 09/20/2014 0043   K 3.5 09/20/2014 0043   CL 102 09/20/2014 0043   CO2 26 09/20/2014 0043   GLUCOSE 101 (H) 09/20/2014 0043   BUN 7 09/20/2014 0043   CREATININE 0.93 09/20/2014 0043   CALCIUM 9.8 09/20/2014 0043   PROT 7.7 09/20/2014 0043   ALBUMIN 4.5 09/20/2014 0043   AST 14 09/20/2014 0043   ALT 13 09/20/2014 0043   ALKPHOS 77 09/20/2014 0043   BILITOT 0.6 09/20/2014 0043   GFRNONAA >90 09/20/2014 0043   GFRAA >90 09/20/2014 0043       Component Value Date/Time   WBC 14.1 (H) 09/20/2014 0043   RBC 5.37 09/20/2014 0043   HGB 16.2 09/20/2014 0043   HCT 45.9 09/20/2014 0043   PLT 260 09/20/2014 0043   MCV 85.5 09/20/2014 0043   MCH 30.2 09/20/2014 0043   MCHC 35.3 09/20/2014 0043   RDW 13.2 09/20/2014 0043   LYMPHSABS 3.2 09/20/2014 0043   MONOABS 0.9 09/20/2014 0043   EOSABS 0.3 09/20/2014 0043   BASOSABS 0.0 09/20/2014 0043    No results found for: POCLITH, LITHIUM   No results found for: PHENYTOIN, PHENOBARB, VALPROATE, CBMZ   .res Assessment: Plan:    Plan:  1. Seroquel  300mg  - 2 at bedtime 2. Prozac  30mg  daily 3. Topamax   200mg  daily   RTC 6 months.  15 minutes spent dedicated to the care of this patient on the date of this encounter to include pre-visit review of records, ordering of medication, post visit documentation, and face-to-face time with the patient discussing depression, anxiety and insomnia. Discussed continuing current medication regimen.  Patient advised to contact office with any questions, adverse effects, or acute worsening in signs and symptoms.  Discussed potential metabolic side effects associated with atypical antipsychotics, as well as potential risk for movement side effects. Advised pt to contact office if movement side effects occur.   There are no diagnoses linked to this encounter.   Please see After Visit Summary for patient specific instructions.  Future Appointments  Date Time Provider Department Center  07/15/2024  3:30 PM Barry Culverhouse Nattalie, NP [facility] None    No orders of the defined types were placed in this encounter.     -------------------------------     [1]  Allergies Allergen Reactions   Lincomycin Hcl Itching and Hives   Azithromycin Itching   Clindamycin/Lincomycin Hives and Itching   Diphenoxylate Other (See Comments)    Unknown, childhood allergy Unknown, childhood allergy   Diphenoxylate-Atropine     Unknown, childhood allergy   Lincomycin Itching and Hives   Doxycycline Rash

## 2025-01-17 ENCOUNTER — Telehealth: Admitting: Adult Health
